# Patient Record
Sex: Female | Born: 1980 | Race: Asian | Hispanic: No | Marital: Married | Smoking: Never smoker
Health system: Southern US, Community
[De-identification: ages and names within clinical notes are randomized; demographics above are authoritative.]

## PROBLEM LIST (undated history)

## (undated) DIAGNOSIS — Z789 Other specified health status: Secondary | ICD-10-CM

## (undated) DIAGNOSIS — O4440 Low lying placenta NOS or without hemorrhage, unspecified trimester: Secondary | ICD-10-CM

## (undated) HISTORY — PX: NO PAST SURGERIES: SHX2092

---

## 2009-02-15 ENCOUNTER — Inpatient Hospital Stay (HOSPITAL_COMMUNITY): Admission: AD | Admit: 2009-02-15 | Discharge: 2009-02-15 | Payer: Self-pay | Admitting: Family Medicine

## 2009-05-16 ENCOUNTER — Ambulatory Visit: Payer: Self-pay | Admitting: Obstetrics and Gynecology

## 2009-05-16 ENCOUNTER — Inpatient Hospital Stay (HOSPITAL_COMMUNITY): Admission: AD | Admit: 2009-05-16 | Discharge: 2009-05-16 | Payer: Self-pay | Admitting: Obstetrics and Gynecology

## 2009-05-22 ENCOUNTER — Ambulatory Visit: Payer: Self-pay | Admitting: Advanced Practice Midwife

## 2009-05-22 ENCOUNTER — Inpatient Hospital Stay (HOSPITAL_COMMUNITY): Admission: AD | Admit: 2009-05-22 | Discharge: 2009-05-23 | Payer: Self-pay | Admitting: Obstetrics & Gynecology

## 2009-05-27 ENCOUNTER — Ambulatory Visit (HOSPITAL_COMMUNITY): Admission: RE | Admit: 2009-05-27 | Discharge: 2009-05-27 | Payer: Self-pay | Admitting: Obstetrics

## 2009-08-04 ENCOUNTER — Ambulatory Visit (HOSPITAL_COMMUNITY): Admission: RE | Admit: 2009-08-04 | Discharge: 2009-08-04 | Payer: Self-pay | Admitting: Obstetrics

## 2009-09-23 ENCOUNTER — Inpatient Hospital Stay (HOSPITAL_COMMUNITY): Admission: AD | Admit: 2009-09-23 | Discharge: 2009-09-25 | Payer: Self-pay | Admitting: Obstetrics & Gynecology

## 2010-06-26 LAB — CBC
HCT: 40.1 % (ref 36.0–46.0)
Hemoglobin: 11.4 g/dL — ABNORMAL LOW (ref 12.0–15.0)
MCV: 88.3 fL (ref 78.0–100.0)
MCV: 88.8 fL (ref 78.0–100.0)
RBC: 3.72 MIL/uL — ABNORMAL LOW (ref 3.87–5.11)
RDW: 13.1 % (ref 11.5–15.5)
RDW: 13.2 % (ref 11.5–15.5)
WBC: 14.3 10*3/uL — ABNORMAL HIGH (ref 4.0–10.5)
WBC: 18.4 10*3/uL — ABNORMAL HIGH (ref 4.0–10.5)

## 2010-06-26 LAB — RPR: RPR Ser Ql: NONREACTIVE

## 2010-06-30 LAB — DIFFERENTIAL
Eosinophils Relative: 7 % — ABNORMAL HIGH (ref 0–5)
Lymphocytes Relative: 5 % — ABNORMAL LOW (ref 12–46)
Monocytes Absolute: 0.6 10*3/uL (ref 0.1–1.0)
Neutro Abs: 12.6 10*3/uL — ABNORMAL HIGH (ref 1.7–7.7)

## 2010-06-30 LAB — URINALYSIS, ROUTINE W REFLEX MICROSCOPIC
Bilirubin Urine: NEGATIVE
Glucose, UA: 100 mg/dL — AB
Ketones, ur: NEGATIVE mg/dL
Nitrite: NEGATIVE
Protein, ur: NEGATIVE mg/dL
pH: 5.5 (ref 5.0–8.0)

## 2010-06-30 LAB — URINE MICROSCOPIC-ADD ON

## 2010-06-30 LAB — CBC
HCT: 32.7 % — ABNORMAL LOW (ref 36.0–46.0)
Hemoglobin: 11.6 g/dL — ABNORMAL LOW (ref 12.0–15.0)
MCV: 92.3 fL (ref 78.0–100.0)
Platelets: 263 10*3/uL (ref 150–400)
RBC: 3.58 MIL/uL — ABNORMAL LOW (ref 3.87–5.11)
RDW: 14.1 % (ref 11.5–15.5)
RDW: 14.2 % (ref 11.5–15.5)
WBC: 14.9 10*3/uL — ABNORMAL HIGH (ref 4.0–10.5)

## 2010-06-30 LAB — GLUCOSE, CAPILLARY

## 2010-07-13 LAB — URINE CULTURE
Colony Count: NO GROWTH
Culture: NO GROWTH

## 2010-07-13 LAB — URINALYSIS, ROUTINE W REFLEX MICROSCOPIC
Glucose, UA: NEGATIVE mg/dL
Nitrite: NEGATIVE
pH: 6 (ref 5.0–8.0)

## 2010-07-13 LAB — URINE MICROSCOPIC-ADD ON

## 2015-02-12 ENCOUNTER — Ambulatory Visit: Payer: PRIVATE HEALTH INSURANCE

## 2019-04-11 NOTE — L&D Delivery Note (Addendum)
OB/GYN Faculty Practice Delivery Note  Jessica Ewing is a 39 y.o. G2P1001 s/p SVD at [redacted]w[redacted]d. She was admitted for SOL/SROM.   ROM: 9h 71m with clear fluid GBS Status: neg Maximum Maternal Temperature: 98.3  Labor Progress: Marland Kitchen [name] had SROM at home at midnight with arrival to L&D in the early morning in latent labor. She received and epidural and progressed spontaneously to vag del.  Delivery Date/Time: December 15, 2019 at 0930 Delivery: Called to room and patient was complete and just starting to push. Head delivered ROA. Nuchal cord present x 1, not tight, but delivered through using the 'somersault manuever'. Shoulder and body delivered in usual fashion. Infant with spontaneous cry, placed on mother's abdomen, dried and stimulated. Cord clamped x 2 after 1-minute delay, and cut by FOB. Cord blood drawn. Placenta delivered spontaneously with gentle cord traction. Fundus firm with massage and Pitocin. Labia, perineum, vagina, and cervix inspected with repair of a 1st deg perineal lac using 3-0  Monocryl in the usual fashion.   Placenta: spont, intact Complications: none Lacerations: 1st deg perineal EBL: 150cc Analgesia: epidural  Postpartum Planning [x]  message to sent to schedule follow-up   Infant: girl  APGARs 9/10  2906g (6lb 6.5oz)  06-21-1973, CNM  12/15/2019 10:07 AM

## 2019-05-12 ENCOUNTER — Other Ambulatory Visit: Payer: Self-pay

## 2019-05-12 ENCOUNTER — Ambulatory Visit (INDEPENDENT_AMBULATORY_CARE_PROVIDER_SITE_OTHER): Payer: Medicaid Other

## 2019-05-12 VITALS — BP 124/77 | HR 95 | Temp 98.2°F | Ht 63.0 in | Wt 128.7 lb

## 2019-05-12 DIAGNOSIS — Z3201 Encounter for pregnancy test, result positive: Secondary | ICD-10-CM | POA: Diagnosis not present

## 2019-05-12 DIAGNOSIS — Z3480 Encounter for supervision of other normal pregnancy, unspecified trimester: Secondary | ICD-10-CM

## 2019-05-12 LAB — POCT URINE PREGNANCY: Preg Test, Ur: POSITIVE — AB

## 2019-05-12 MED ORDER — BLOOD PRESSURE KIT DEVI: 1.0000 | 0 refills | Status: DC

## 2019-05-12 NOTE — Progress Notes (Addendum)
Ms. Stillson presents today for UPT. She has no unusual complaints.  LMP:03/20/2019  EDD 12/25/2019 [redacted]w[redacted]d    OBJECTIVE: Appears well, in no apparent distress.  OB History    Gravida  2   Para  1   Term  1   Preterm      AB      Living  1     SAB      TAB      Ectopic      Multiple      Live Births  1          Home UPT Result:POSITIVE X2 In-Office UPT result:POSITIVE  I have reviewed the patient's medical, obstetrical, social, and family histories, and medications.   ASSESSMENT: Positive pregnancy test  PLAN Prenatal care to be completed at: [facility] BP Cuff ordered.  Patient seen and assessed by nursing staff during this encounter. I have reviewed the chart and agree with the documentation and plan.  Coral Ceo, MD 05/30/2019 10:29 AM    -------------------------------------------------------

## 2019-05-30 ENCOUNTER — Other Ambulatory Visit: Payer: Self-pay | Admitting: Obstetrics

## 2019-07-03 ENCOUNTER — Encounter: Payer: Medicaid Other | Admitting: Family Medicine

## 2019-07-07 ENCOUNTER — Other Ambulatory Visit (HOSPITAL_COMMUNITY)
Admission: RE | Admit: 2019-07-07 | Discharge: 2019-07-07 | Disposition: A | Discharge status: Home / Self Care | Payer: Medicaid Other | Source: Ambulatory Visit | Attending: Obstetrics | Admitting: Obstetrics

## 2019-07-07 ENCOUNTER — Encounter: Payer: Self-pay | Admitting: Obstetrics

## 2019-07-07 ENCOUNTER — Other Ambulatory Visit: Payer: Self-pay

## 2019-07-07 ENCOUNTER — Ambulatory Visit (INDEPENDENT_AMBULATORY_CARE_PROVIDER_SITE_OTHER): Payer: Medicaid Other | Admitting: Obstetrics

## 2019-07-07 VITALS — BP 125/72 | HR 83 | Wt 131.0 lb

## 2019-07-07 DIAGNOSIS — O0990 Supervision of high risk pregnancy, unspecified, unspecified trimester: Secondary | ICD-10-CM | POA: Insufficient documentation

## 2019-07-07 DIAGNOSIS — O09529 Supervision of elderly multigravida, unspecified trimester: Secondary | ICD-10-CM

## 2019-07-07 DIAGNOSIS — Z3A15 15 weeks gestation of pregnancy: Secondary | ICD-10-CM

## 2019-07-07 MED ORDER — VITAFOL ULTRA 29-0.6-0.4-200 MG PO CAPS
1.0000 | ORAL_CAPSULE | Freq: Every day | ORAL | 4 refills | Status: DC
Start: 2019-07-07 — End: 2019-09-11

## 2019-07-07 NOTE — Progress Notes (Signed)
Subjective:    Jessica Ewing is being seen today for her first obstetrical visit.  This is a planned pregnancy. She is at [redacted]w[redacted]d gestation. Her obstetrical history is significant for advanced maternal age. Relationship with FOB: spouse, living together. Patient does intend to breast feed. Pregnancy history fully reviewed.  The information documented in the HPI was reviewed and verified.  Menstrual History: OB History    Gravida  2   Para  1   Term  1   Preterm      AB      Living  1     SAB      TAB      Ectopic      Multiple      Live Births  1            Patient's last menstrual period was 03/20/2019.    History reviewed. No pertinent past medical history.  History reviewed. No pertinent surgical history.  (Not in a hospital admission)  No Known Allergies  Social History   Tobacco Use  . Smoking status: Never Smoker  . Smokeless tobacco: Never Used  Substance Use Topics  . Alcohol use: Never    History reviewed. No pertinent family history.   Review of Systems Constitutional: negative for weight loss Gastrointestinal: negative for vomiting Genitourinary:negative for genital lesions and vaginal discharge and dysuria Musculoskeletal:negative for back pain Behavioral/Psych: negative for abusive relationship, depression, illegal drug usage and tobacco use    Objective:    BP 125/72   Pulse 83   Wt 131 lb (59.4 kg)   LMP 03/20/2019   BMI 23.21 kg/m  General Appearance:    Alert, cooperative, no distress, appears stated age  Head:    Normocephalic, without obvious abnormality, atraumatic  Eyes:    PERRL, conjunctiva/corneas clear, EOM's intact, fundi    benign, both eyes  Ears:    Normal TM's and external ear canals, both ears  Nose:   Nares normal, septum midline, mucosa normal, no drainage    or sinus tenderness  Throat:   Lips, mucosa, and tongue normal; teeth and gums normal  Neck:   Supple, symmetrical, trachea midline, no adenopathy;     thyroid:  no enlargement/tenderness/nodules; no carotid   bruit or JVD  Back:     Symmetric, no curvature, ROM normal, no CVA tenderness  Lungs:     Clear to auscultation bilaterally, respirations unlabored  Chest Wall:    No tenderness or deformity   Heart:    Regular rate and rhythm, S1 and S2 normal, no murmur, rub   or gallop  Breast Exam:    No tenderness, masses, or nipple abnormality  Abdomen:     Soft, non-tender, bowel sounds active all four quadrants,    no masses, no organomegaly  Genitalia:    Normal female without lesion, discharge or tenderness  Extremities:   Extremities normal, atraumatic, no cyanosis or edema  Pulses:   2+ and symmetric all extremities  Skin:   Skin color, texture, turgor normal, no rashes or lesions  Lymph nodes:   Cervical, supraclavicular, and axillary nodes normal  Neurologic:   CNII-XII intact, normal strength, sensation and reflexes    throughout      Lab Review Urine pregnancy test Labs reviewed yes Radiologic studies reviewed no Assessment:    Pregnancy at [redacted]w[redacted]d weeks    Plan:     1. Supervision of high risk pregnancy, antepartum Rx: - Cytology - PAP - Cervicovaginal ancillary only -  Urine Culture - Obstetric Panel, Including HIV - Babyscripts Schedule Optimization - Genetic Screening - US MFM OB DETAIL +14 WK; Future - Prenat-Fe Poly-Methfol-FA-DHA (VITAFOL ULTRA) 29-0.6-0.4-200 MG CAPS; Take 1 capsule by mouth daily before breakfast.  Dispense: 90 capsule; Refill: 4  2. Antepartum multigravida of advanced maternal age   Prenatal vitamins.  Counseling provided regarding continued use of seat belts, cessation of alcohol consumption, smoking or use of illicit drugs; infection precautions i.e., influenza/TDAP immunizations, toxoplasmosis,CMV, parvovirus, listeria and varicella; workplace safety, exercise during pregnancy; routine dental care, safe medications, sexual activity, hot tubs, saunas, pools, travel, caffeine use, fish and  methlymercury, potential toxins, hair treatments, varicose veins Weight gain recommendations per IOM guidelines reviewed: underweight/BMI< 18.5--> gain 28 - 40 lbs; normal weight/BMI 18.5 - 24.9--> gain 25 - 35 lbs; overweight/BMI 25 - 29.9--> gain 15 - 25 lbs; obese/BMI >30->gain  11 - 20 lbs Problem list reviewed and updated. FIRST/CF mutation testing/NIPT/QUAD SCREEN/fragile X/Ashkenazi Jewish population testing/Spinal muscular atrophy discussed: requested. Role of ultrasound in pregnancy discussed; fetal survey: requested. Amniocentesis discussed: not indicated.  Meds ordered this encounter  Medications  . Prenat-Fe Poly-Methfol-FA-DHA (VITAFOL ULTRA) 29-0.6-0.4-200 MG CAPS    Sig: Take 1 capsule by mouth daily before breakfast.    Dispense:  90 capsule    Refill:  4   Orders Placed This Encounter  Procedures  . Urine Culture  . [place] MFM OB DETAIL +14 WK    Standing Status:   Future    Standing Expiration Date:   09/05/2020    Order Specific Question:   Reason for Exam (SYMPTOM  OR DIAGNOSIS REQUIRED)    Answer:   Anatomy    Order Specific Question:   Preferred Location    Answer:   [facility]  . Obstetric Panel, Including HIV  . Genetic Screening    Follow up in 4 weeks. 50% of 25 min visit spent on counseling and coordination of care.     Brock Bad, MD 07/07/2019 1:50 PM

## 2019-07-08 ENCOUNTER — Other Ambulatory Visit: Payer: Self-pay | Admitting: Obstetrics

## 2019-07-08 DIAGNOSIS — N760 Acute vaginitis: Secondary | ICD-10-CM

## 2019-07-08 DIAGNOSIS — B9689 Other specified bacterial agents as the cause of diseases classified elsewhere: Secondary | ICD-10-CM

## 2019-07-08 LAB — CERVICOVAGINAL ANCILLARY ONLY
Bacterial Vaginitis (gardnerella): POSITIVE — AB
Candida Glabrata: NEGATIVE
Candida Vaginitis: NEGATIVE
Chlamydia: NEGATIVE
Comment: NEGATIVE
Comment: NEGATIVE
Comment: NEGATIVE
Comment: NEGATIVE
Comment: NEGATIVE
Comment: NORMAL
Neisseria Gonorrhea: NEGATIVE
Trichomonas: NEGATIVE

## 2019-07-08 LAB — CYTOLOGY - PAP
Comment: NEGATIVE
Diagnosis: NEGATIVE
High risk HPV: NEGATIVE

## 2019-07-08 MED ORDER — TINIDAZOLE 500 MG PO TABS: 1000.0000 mg | ORAL_TABLET | Freq: Every day | ORAL | 2 refills | Status: DC

## 2019-07-09 ENCOUNTER — Other Ambulatory Visit: Payer: Self-pay

## 2019-07-09 MED ORDER — VITAFOL GUMMIES 3.33-0.333-34.8 MG PO CHEW: 3.0000 | CHEWABLE_TABLET | Freq: Every day | ORAL | 6 refills | Status: DC

## 2019-07-31 ENCOUNTER — Other Ambulatory Visit: Payer: Self-pay

## 2019-07-31 ENCOUNTER — Ambulatory Visit (HOSPITAL_COMMUNITY): Payer: Medicaid Other | Admitting: *Deleted

## 2019-07-31 ENCOUNTER — Ambulatory Visit (HOSPITAL_COMMUNITY)
Admission: RE | Admit: 2019-07-31 | Discharge: 2019-07-31 | Disposition: A | Discharge status: Home / Self Care | Payer: Medicaid Other | Source: Ambulatory Visit | Attending: Obstetrics and Gynecology | Admitting: Obstetrics and Gynecology

## 2019-07-31 ENCOUNTER — Encounter (HOSPITAL_COMMUNITY): Payer: Self-pay

## 2019-07-31 ENCOUNTER — Ambulatory Visit (HOSPITAL_COMMUNITY): Payer: Medicaid Other

## 2019-07-31 VITALS — BP 124/74 | HR 78 | Temp 98.1°F

## 2019-07-31 DIAGNOSIS — Z3A19 19 weeks gestation of pregnancy: Secondary | ICD-10-CM

## 2019-07-31 DIAGNOSIS — O09529 Supervision of elderly multigravida, unspecified trimester: Secondary | ICD-10-CM | POA: Insufficient documentation

## 2019-07-31 DIAGNOSIS — O0990 Supervision of high risk pregnancy, unspecified, unspecified trimester: Secondary | ICD-10-CM | POA: Insufficient documentation

## 2019-07-31 DIAGNOSIS — Z363 Encounter for antenatal screening for malformations: Secondary | ICD-10-CM

## 2019-07-31 DIAGNOSIS — O09522 Supervision of elderly multigravida, second trimester: Secondary | ICD-10-CM | POA: Insufficient documentation

## 2019-07-31 DIAGNOSIS — Z3480 Encounter for supervision of other normal pregnancy, unspecified trimester: Secondary | ICD-10-CM

## 2019-08-04 ENCOUNTER — Telehealth (INDEPENDENT_AMBULATORY_CARE_PROVIDER_SITE_OTHER): Payer: Medicaid Other | Admitting: Obstetrics and Gynecology

## 2019-08-04 ENCOUNTER — Encounter: Payer: Self-pay | Admitting: Obstetrics and Gynecology

## 2019-08-04 DIAGNOSIS — Z3A19 19 weeks gestation of pregnancy: Secondary | ICD-10-CM

## 2019-08-04 DIAGNOSIS — Z3482 Encounter for supervision of other normal pregnancy, second trimester: Secondary | ICD-10-CM

## 2019-08-04 DIAGNOSIS — O900 Disruption of cesarean delivery wound: Secondary | ICD-10-CM | POA: Diagnosis not present

## 2019-08-04 DIAGNOSIS — Z3480 Encounter for supervision of other normal pregnancy, unspecified trimester: Secondary | ICD-10-CM

## 2019-08-04 NOTE — Progress Notes (Signed)
OBSTETRICS PRENATAL VIRTUAL VISIT ENCOUNTER NOTE  Provider location: [facility]   I connected with Jessica Ewing on 08/04/19 at  1:30 PM EDT by MyChart Video Encounter at home and verified that I am speaking with the correct person using two identifiers.   I discussed the limitations, risks, security and privacy concerns of performing an evaluation and management service virtually and the availability of in person appointments. I also discussed with the patient that there may be a patient responsible charge related to this service. The patient expressed understanding and agreed to proceed. Subjective:  Jessica Ewing is a 39 y.o. G2P1001 at [redacted]w[redacted]d being seen today for ongoing prenatal care.  She is currently monitored for the following issues for this low-risk pregnancy and has Supervision of other normal pregnancy, antepartum on their problem list.  Patient reports no complaints.  Contractions: Not present. Vag. Bleeding: None.  Movement: Present. Denies any leaking of fluid.   The following portions of the patient's history were reviewed and updated as appropriate: allergies, current medications, past family history, past medical history, past social history, past surgical history and problem list.   Objective:  There were no vitals filed for this visit.  Fetal Status:     Movement: Present     General:  Alert, oriented and cooperative. Patient is in no acute distress.  Respiratory: Normal respiratory effort, no problems with respiration noted  Mental Status: Normal mood and affect. Normal behavior. Normal judgment and thought content.  Rest of physical exam deferred due to type of encounter  Imaging: [place] MFM OB DETAIL +14 WK  Result Date: 07/31/2019 ----------------------------------------------------------------------  OBSTETRICS REPORT                       (Signed Final 07/31/2019 11:43 am)  ---------------------------------------------------------------------- Patient Info  ID #:       [id number]                          D.O.B.:  02-03-1981 (38 yrs)  Name:       Jessica Ewing                    Visit Date: 07/31/2019 09:05 am ---------------------------------------------------------------------- Performed By  Performed By:     Tommie Raymond BS,       Ref. Address:     [address], [address]                                                             [address]                                                             [phone]  [place]                                                             [id number]  Attending:        Ma Rings MD         [facility]  Referred By:      [facility] ---------------------------------------------------------------------- Orders   #  Description                          Code         Ordered By   1  [place] MFM OB DETAIL +14 WK              76811.01     Coral Ceo  ----------------------------------------------------------------------   #  Order #                    Accession #                 Episode #   1  [id number]                   [id number]                  [id number]  ---------------------------------------------------------------------- Indications   Advanced maternal age multigravida 16+,        O61.522   second trimester   [redacted] weeks gestation of pregnancy                Z3A.19   Encounter for antenatal screening for          Z36.3   malformations  ---------------------------------------------------------------------- Fetal Evaluation  Num Of Fetuses:         1  Fetal Heart Rate(bpm):  143  Cardiac Activity:       Observed  Presentation:           Cephalic  Placenta:               Anterior  P. Cord Insertion:      Not well visualized  Amniotic Fluid  AFI FV:      Within normal limits                               Largest Pocket(cm)                              5.5 ---------------------------------------------------------------------- Biometry  BPD:      46.2  mm     G. Age:  20w 0d         86  %    CI:        79.21   %    70 - 86  FL/HC:      17.6   %    16.1 - 18.3  HC:      164.1  mm     G. Age:  19w 1d         50  %    HC/AC:      1.15        1.09 - 1.39  AC:       143   mm     G. Age:  19w 5d         68  %    FL/BPD:     62.3   %  FL:       28.8  mm     G. Age:  18w 6d         37  %    FL/AC:      20.1   %    20 - 24  HUM:      27.4  mm     G. Age:  18w 5d         43  %  CER:        19  mm     G. Age:  18w 4d         35  %  NFT:       4.4  mm  LV:        7.1  mm  CM:        4.3  mm  Est. FW:     284  gm    0 lb 10 oz      63  % ---------------------------------------------------------------------- OB History  Gravidity:    2         Term:   1        Prem:   0        SAB:   0  TOP:          0       Ectopic:  0        Living: 0 ---------------------------------------------------------------------- Gestational Age  LMP:           19w 0d        Date:  03/20/19                 EDD:   12/25/19  U/S Today:     19w 3d                                        EDD:   12/22/19  Best:          19w 0d     Det. By:  LMP  (03/20/19)          EDD:   12/25/19 ---------------------------------------------------------------------- Anatomy  Cranium:               Appears normal         LVOT:                   Appears normal  Cavum:                 Appears normal         Aortic Arch:            Appears normal  Ventricles:            Appears normal  Ductal Arch:            Appears normal  Choroid Plexus:        Appears normal         Diaphragm:              Appears normal  Cerebellum:            Appears normal         Stomach:                Appears normal, left                                                                        sided  Posterior Fossa:        Appears normal         Abdomen:                Appears normal  Nuchal Fold:           Appears normal         Abdominal Wall:         Appears nml (cord                                                                        insert, abd wall)  Face:                  Appears normal         Cord Vessels:           Appears normal (3                         (orbits and profile)                           vessel cord)  Lips:                  Appears normal         Kidneys:                Appear normal  Palate:                Appears normal         Bladder:                Appears normal  Thoracic:              Appears normal         Spine:                  Appears normal  Heart:                 Echogenic focus        Upper Extremities:      Appears normal  in LV  RVOT:                  Appears normal         Lower Extremities:      Appears normal  Other:  Female gender Open hands visualized. Heels and 5th digit previously          seen. Nasal bone visualized. ---------------------------------------------------------------------- Cervix Uterus Adnexa  Cervix  Length:            4.4  cm.  Normal appearance by transabdominal scan.  Uterus  No abnormality visualized.  Left Ovary  Within normal limits. No adnexal mass visualized.  Right Ovary  Not visualized. No adnexal mass visualized.  Cul De Sac  No free fluid seen.  Adnexa  No abnormality visualized. ---------------------------------------------------------------------- Comments  This patient was seen for a detailed fetal anatomy scan due  to advanced maternal age.  She denies any significant past medical history and denies  any problems in her current pregnancy.  The patient has not had any screening tests for fetal  aneuploidy drawn in her current pregnancy.  She was informed that the fetal growth and amniotic fluid  level were appropriate for her gestational age.  On today's exam, an intracardiac echogenic focus was noted  in the left ventricle of the  fetal heart.  The small association  between an echogenic focus and Down syndrome was  discussed. Due to the echogenic focus noted today, the  patient was offered and declined an amniocentesis today for  definitive diagnosis of fetal aneuploidy.  She will have a cell  free DNA test to screen for fetal aneuploidy instead.  The patient was informed that anomalies may be missed due  to technical limitations. If the fetus is in a suboptimal position  or maternal habitus is increased, visualization of the fetus in  the maternal uterus may be impaired.  The patient had a Sequenome cell free DNA test drawn  following today's ultrasound exam.  Follow up as indicated.  All conversations were held with the patient today with the  help of a [place] interpreter. ----------------------------------------------------------------------                   Johnell Comings, MD Electronically Signed Final Report   07/31/2019 11:43 am ----------------------------------------------------------------------   Assessment and Plan:  Pregnancy: G2P1001 at [redacted]w[redacted]d 1. Supervision of other normal pregnancy, antepartum Patient is doing well without complaints Reviewed anatomy ultrasound report [id number] collected today Patient to come in person for next ob visit as new ob labs were not collected Patient plans to pick up BP cuff today  Preterm labor symptoms and general obstetric precautions including but not limited to vaginal bleeding, contractions, leaking of fluid and fetal movement were reviewed in detail with the patient. I discussed the assessment and treatment plan with the patient. The patient was provided an opportunity to ask questions and all were answered. The patient agreed with the plan and demonstrated an understanding of the instructions. The patient was advised to call back or seek an in-person office evaluation/go to MAU at [facility] for any urgent or concerning symptoms. Please refer to After Visit  Summary for other counseling recommendations.   I provided 11 minutes of face-to-face time during this encounter.  Return in about 4 weeks (around 09/01/2019) for ROB, Low risk, in person; patient needs new ob labs.  No future appointments.  Mora Bellman, MD [facility] [place]

## 2019-08-04 NOTE — Progress Notes (Signed)
I connected with  Jessica Ewing on 08/04/19 by a video enabled telemedicine application and verified that I am speaking with the correct person using two identifiers.   I discussed the limitations of evaluation and management by telemedicine. The patient expressed understanding and agreed to proceed.  Mychart OB, reports no problems today.  She wants to discuss her Ultrasound results 07/31/19.  She will go and pick up her BP cuff later today.

## 2019-08-05 LAB — MATERNIT21 PLUS CORE+SCA
Fetal Fraction: 9
Monosomy X (Turner Syndrome): NOT DETECTED
Result (T21): NEGATIVE
Trisomy 13 (Patau syndrome): NEGATIVE
Trisomy 18 (Edwards syndrome): NEGATIVE
Trisomy 21 (Down syndrome): NEGATIVE
XXX (Triple X Syndrome): NOT DETECTED
XXY (Klinefelter Syndrome): NOT DETECTED
XYY (Jacobs Syndrome): NOT DETECTED

## 2019-08-06 ENCOUNTER — Telehealth (HOSPITAL_COMMUNITY): Payer: Self-pay | Admitting: Genetic Counselor

## 2019-08-06 NOTE — Telephone Encounter (Signed)
Attempted to call Ms. Imbert with the help of [employer], [place] [phone]; however, call went to voicemail. LVM for Ms. Gillispie re: good news about screening results. Requested a call back to my direct line to discuss these in more detail, as no identifiers were provided in voicemail message.   Gershon Crane, MS, [facility] Genetic Counselor

## 2019-08-06 NOTE — Telephone Encounter (Signed)
I called Jessica Ewing with the help of [place] Jessica Ewing, Jessica Ewing to discuss her negative noninvasive prenatal screening (NIPS)/cell free DNA (cfDNA) testing result. Specifically, Jessica Ewing had MaterniT21 testing through [employer]. Testing was offered because of an echogenic intracardiac focus (EIF) that was identified on ultrasound. These negative results demonstrated an expected representation of chromosome 21, 18, 13, and sex chromosome material, greatly reducing the likelihood of trisomies 44, 1, or 65 and sex chromosome aneuploidies for the pregnancy. We discussed that the EIF identified in the baby's heart is likely a normal variant in the context of this low-risk NIPS result.  NIPS analyzes placental (fetal) DNA in maternal circulation. NIPS is considered to be highly specific and sensitive, but is not considered to be a diagnostic test. We reviewed that this testing identifies 91-99% of pregnancies with trisomies 8, 69, and 69, as well as sex chromosome abnormalities, but does not test for all genetic conditions. Diagnostic testing via amniocentesis is available should she be interested in confirming this result. Jessica Ewing was relieved to hear these results and confirmed that she had no questions about these results at this time.  Jessica Crane, MS, [facility] Genetic Counselor

## 2019-08-18 ENCOUNTER — Telehealth: Payer: Self-pay

## 2019-08-18 NOTE — Telephone Encounter (Signed)
Pt called reporting that she has a runny nose, itching, and cough. I advised pt that this may be allergies and to try benadryl as directed on the box. Pt denies any cramping or bleeding and reports good fetal movement. I advised pt that if it does not work or if she is having any trouble breathing she should go to the hospital for evaluation. Pt voices understanding.

## 2019-08-29 ENCOUNTER — Emergency Department (HOSPITAL_COMMUNITY)
Admission: EM | Admit: 2019-08-29 | Discharge: 2019-08-29 | Disposition: A | Discharge status: Home / Self Care | Payer: Medicaid Other | Attending: Emergency Medicine | Admitting: Emergency Medicine

## 2019-08-29 ENCOUNTER — Encounter (HOSPITAL_COMMUNITY): Payer: Self-pay | Admitting: Emergency Medicine

## 2019-08-29 DIAGNOSIS — U071 COVID-19: Secondary | ICD-10-CM | POA: Insufficient documentation

## 2019-08-29 DIAGNOSIS — Z3A00 Weeks of gestation of pregnancy not specified: Secondary | ICD-10-CM | POA: Insufficient documentation

## 2019-08-29 DIAGNOSIS — E876 Hypokalemia: Secondary | ICD-10-CM | POA: Diagnosis not present

## 2019-08-29 DIAGNOSIS — O98512 Other viral diseases complicating pregnancy, second trimester: Secondary | ICD-10-CM | POA: Diagnosis not present

## 2019-08-29 DIAGNOSIS — O98519 Other viral diseases complicating pregnancy, unspecified trimester: Secondary | ICD-10-CM | POA: Insufficient documentation

## 2019-08-29 DIAGNOSIS — O99282 Endocrine, nutritional and metabolic diseases complicating pregnancy, second trimester: Secondary | ICD-10-CM | POA: Diagnosis not present

## 2019-08-29 DIAGNOSIS — Z79899 Other long term (current) drug therapy: Secondary | ICD-10-CM | POA: Insufficient documentation

## 2019-08-29 LAB — CBC WITH DIFFERENTIAL/PLATELET
Abs Immature Granulocytes: 0.03 10*3/uL (ref 0.00–0.07)
Basophils Absolute: 0 10*3/uL (ref 0.0–0.1)
Basophils Relative: 0 %
Eosinophils Absolute: 0.1 10*3/uL (ref 0.0–0.5)
Eosinophils Relative: 1 %
HCT: 37.9 % (ref 36.0–46.0)
Hemoglobin: 12.6 g/dL (ref 12.0–15.0)
Immature Granulocytes: 0 %
Lymphocytes Relative: 12 %
Lymphs Abs: 1 10*3/uL (ref 0.7–4.0)
MCH: 28.7 pg (ref 26.0–34.0)
MCHC: 33.2 g/dL (ref 30.0–36.0)
MCV: 86.3 fL (ref 80.0–100.0)
Monocytes Absolute: 0.5 10*3/uL (ref 0.1–1.0)
Monocytes Relative: 7 %
Neutro Abs: 6.5 10*3/uL (ref 1.7–7.7)
Neutrophils Relative %: 80 %
Platelets: 260 10*3/uL (ref 150–400)
RBC: 4.39 MIL/uL (ref 3.87–5.11)
RDW: 15 % (ref 11.5–15.5)
WBC: 8.1 10*3/uL (ref 4.0–10.5)
nRBC: 0 % (ref 0.0–0.2)

## 2019-08-29 LAB — COMPREHENSIVE METABOLIC PANEL
ALT: 24 U/L (ref 0–44)
AST: 23 U/L (ref 15–41)
Albumin: 2.9 g/dL — ABNORMAL LOW (ref 3.5–5.0)
Alkaline Phosphatase: 59 U/L (ref 38–126)
Anion gap: 12 (ref 5–15)
BUN: <5 mg/dL — ABNORMAL LOW (ref 6–20)
CO2: 22 mmol/L (ref 22–32)
Calcium: 8.1 mg/dL — ABNORMAL LOW (ref 8.9–10.3)
Chloride: 98 mmol/L (ref 98–111)
Creatinine, Ser: 0.63 mg/dL (ref 0.44–1.00)
GFR calc Af Amer: >60 mL/min (ref >60)
GFR calc non Af Amer: >60 mL/min (ref >60)
Glucose, Bld: 87 mg/dL (ref 70–99)
Potassium: 3.3 mmol/L — ABNORMAL LOW (ref 3.5–5.1)
Sodium: 132 mmol/L — ABNORMAL LOW (ref 135–145)
Total Bilirubin: 0.9 mg/dL (ref 0.3–1.2)
Total Protein: 6.6 g/dL (ref 6.5–8.1)

## 2019-08-29 LAB — LACTIC ACID, PLASMA: Lactic Acid, Venous: 0.9 mmol/L (ref 0.5–1.9)

## 2019-08-29 LAB — URINALYSIS, ROUTINE W REFLEX MICROSCOPIC
Bilirubin Urine: NEGATIVE
Glucose, UA: NEGATIVE mg/dL
Hgb urine dipstick: NEGATIVE
Ketones, ur: 80 mg/dL — AB
Leukocytes,Ua: NEGATIVE
Nitrite: NEGATIVE
Protein, ur: NEGATIVE mg/dL
Specific Gravity, Urine: 1.012 (ref 1.005–1.030)
pH: 6 (ref 5.0–8.0)

## 2019-08-29 LAB — POC SARS CORONAVIRUS 2 AG -  ED: SARS Coronavirus 2 Ag: POSITIVE — AB

## 2019-08-29 MED ORDER — SODIUM CHLORIDE 0.9 % IV BOLUS
1000.0000 mL | Freq: Once | INTRAVENOUS | Status: AC
  Administered 2019-08-29: 1000 mL via INTRAVENOUS

## 2019-08-29 MED ORDER — POTASSIUM CHLORIDE CRYS ER 20 MEQ PO TBCR
20.0000 meq | EXTENDED_RELEASE_TABLET | Freq: Once | ORAL | Status: AC
  Administered 2019-08-29: 20 meq via ORAL
  Filled 2019-08-29: qty 1

## 2019-08-29 MED ORDER — ACETAMINOPHEN 325 MG PO TABS
650.0000 mg | ORAL_TABLET | Freq: Once | ORAL | Status: AC
  Administered 2019-08-29: 650 mg via ORAL
  Filled 2019-08-29: qty 2

## 2019-08-29 NOTE — ED Provider Notes (Addendum)
[facility] EMERGENCY DEPARTMENT Provider Note   CSN: [id number] Arrival date & time: 08/29/19  0349     History Chief Complaint  Patient presents with  . URI    Jessica Ewing is a 39 y.o. female.  HPI      Jessica Ewing is a 39 y.o. female, presenting to the ED with fever for the last 3 days.  T-max 102 F.  She has been intermittently taking Tylenol for her fever with last dose last night. Occasional nonproductive cough and nasal congestion.  During my initial MSE assessment in triage, patient mentions some right-sided abdominal discomfort, however, she denies any such discomfort during my subsequent assessment. She is followed by Dr. Jodi Mourning, OB/GYN.  EDD December 25, 2019.  She denies shortness of breath, chest pain, vaginal bleeding, vaginal discharge, urinary symptoms, dizziness, syncope, or any other complaints.   History reviewed. No pertinent past medical history.  Patient Active Problem List   Diagnosis Date Noted  . Supervision of other normal pregnancy, antepartum 05/12/2019    Past Surgical History:  Procedure Laterality Date  . NO PAST SURGERIES       OB History    Gravida  2   Para  1   Term  1   Preterm      AB      Living  1     SAB      TAB      Ectopic      Multiple      Live Births  1           No family history on file.  Social History   Tobacco Use  . Smoking status: Never Smoker  . Smokeless tobacco: Never Used  Substance Use Topics  . Alcohol use: Never  . Drug use: Never    Home Medications Prior to Admission medications   Medication Sig Start Date End Date Taking? Authorizing Provider  Blood Pressure Monitoring (BLOOD PRESSURE KIT) DEVI 1 kit by Does not apply route once a week. Check Blood Pressure regularly and record readings into the [employer] App.  Large Cuff.  DX O90.0 05/12/19   Shelly Bombard, MD  Prenat-Fe Poly-Methfol-FA-DHA (VITAFOL ULTRA) 29-0.6-0.4-200 MG CAPS Take 1 capsule  by mouth daily before breakfast. Patient not taking: Reported on 07/31/2019 07/07/19   Shelly Bombard, MD  Prenatal Vit-Fe Phos-FA-Omega (VITAFOL GUMMIES) 3.33-0.333-34.8 MG CHEW Chew 3 Doses by mouth daily. 07/09/19   Shelly Bombard, MD  tinidazole (TINDAMAX) 500 MG tablet Take 2 tablets (1,000 mg total) by mouth daily with breakfast. 07/08/19   Shelly Bombard, MD    Allergies    Patient has no known allergies.  Review of Systems   Review of Systems  Constitutional: Positive for fever.  HENT: Positive for congestion and rhinorrhea. Negative for sore throat, trouble swallowing and voice change.   Respiratory: Negative for shortness of breath.   Cardiovascular: Negative for chest pain.  Gastrointestinal: Negative for abdominal pain, diarrhea, nausea and vomiting.  Genitourinary: Negative for dysuria, flank pain, vaginal bleeding and vaginal discharge.  Musculoskeletal: Negative for back pain and neck pain.  Neurological: Negative for dizziness, syncope, weakness and headaches.  All other systems reviewed and are negative.   Physical Exam Updated Vital Signs BP 129/69 (BP Location: Left Arm)   Pulse (!) 101   Temp (!) 100.9 F (38.3 C) (Oral)   Resp 16   Ht _0  (1.6 m)   Wt 59 kg  LMP 03/20/2019   SpO2 100%   BMI 23.03 kg/m   Physical Exam Vitals and nursing note reviewed.  Constitutional:      General: She is not in acute distress.    Appearance: She is well-developed. She is not diaphoretic.  HENT:     Head: Normocephalic and atraumatic.     Mouth/Throat:     Mouth: Mucous membranes are moist.     Pharynx: Oropharynx is clear.  Eyes:     Conjunctiva/sclera: Conjunctivae normal.  Cardiovascular:     Rate and Rhythm: Normal rate and regular rhythm.     Pulses: Normal pulses.          Radial pulses are 2+ on the right side and 2+ on the left side.       Posterior tibial pulses are 2+ on the right side and 2+ on the left side.     Heart sounds: Normal heart  sounds.     Comments: Tactile temperature in the extremities appropriate and equal bilaterally. Pulmonary:     Effort: Pulmonary effort is normal. No respiratory distress.     Breath sounds: Normal breath sounds.     Comments: No increased work of breathing.  Speaks in full sentences without difficulty. Abdominal:     Palpations: Abdomen is soft.     Tenderness: There is no abdominal tenderness. There is no guarding.     Comments: Gravid abdomen without any tenderness.  Musculoskeletal:     Cervical back: Neck supple.     Right lower leg: No edema.     Left lower leg: No edema.  Lymphadenopathy:     Cervical: No cervical adenopathy.  Skin:    General: Skin is warm and dry.  Neurological:     Mental Status: She is alert.  Psychiatric:        Mood and Affect: Mood and affect normal.        Speech: Speech normal.        Behavior: Behavior normal.     ED Results / Procedures / Treatments   Labs (all labs ordered are listed, but only abnormal results are displayed) Labs Reviewed  URINALYSIS, ROUTINE W REFLEX MICROSCOPIC - Abnormal; Notable for the following components:      Result Value   APPearance HAZY (*)    Ketones, ur 80 (*)    All other components within normal limits  COMPREHENSIVE METABOLIC PANEL - Abnormal; Notable for the following components:   Sodium 132 (*)    Potassium 3.3 (*)    BUN <5 (*)    Calcium 8.1 (*)    Albumin 2.9 (*)    All other components within normal limits  POC SARS CORONAVIRUS 2 AG -  ED - Abnormal; Notable for the following components:   SARS Coronavirus 2 Ag POSITIVE (*)    All other components within normal limits  URINE CULTURE  CULTURE, BLOOD (ROUTINE X 2)  CULTURE, BLOOD (ROUTINE X 2)  CBC WITH DIFFERENTIAL/PLATELET  LACTIC ACID, PLASMA  LACTIC ACID, PLASMA    EKG None  Radiology No results found.  Procedures Procedures (including critical care time)  Medications Ordered in ED Medications  sodium chloride 0.9 % bolus  1,000 mL (0 mLs Intravenous Stopped 08/29/19 1527)  potassium chloride SA (KLOR-CON) CR tablet 20 mEq (20 mEq Oral Given 08/29/19 1349)  acetaminophen (TYLENOL) tablet 650 mg (650 mg Oral Given 08/29/19 1349)    ED Course  I have reviewed the triage vital signs and the nursing  notes.  Pertinent labs & imaging results that were available during my care of the patient were reviewed by me and considered in my medical decision making (see chart for details).  Clinical Course as of Aug 29 1847  Fri Aug 29, 2019  0949 Spoke with Anderson Malta, CNM at [facility].  We discussed the patient's symptoms.  She advises to start the patient's work-up here in our ED.   [SJ]  [id number] Atempted to call number listed in Amion for OB rapid response ([phone]), but reached a busy signal.   [SJ]  1241 Rapid OB RN assessed the patient.  The patient told her she has not been having any abdominal discomfort.  She has been cleared from an OB standpoint.   [SJ]  2458 Patient reassessed.  She denies any current complaints.  Continues to deny any abdominal pain.   [SJ]  0998 Corrects to about 9.0 mg/dL when hypoalbuminemia is taken into account.  Calcium(!): 8.1 [SJ]  1541 Patient's manual pulse rate at 96.  Pulse Rate(!): 102 [SJ]    Clinical Course User Index [SJ] Joy, Helane Gunther, PA-C   MDM Rules/Calculators/A&P                       Patient presents ultimately complaining of fever and nasal congestion. Initial work-up, including labs, ordered based on the fact the patient was complaining of fever and abdominal pain while in triage. Patient is nontoxic appearing, not tachypneic, not hypotensive, maintains excellent SPO2 on room air, and is in no apparent distress.  Febrile and mildly tachycardic. Fetal heart rate around 160.  Patient has felt fetal movement as she normally would. I have reviewed the patient's chart to obtain more information.   I reviewed and interpreted the patient's labs. Mild hyponatremia addressed  with IV fluids.  Mild hypokalemia also noted and addressed. Ketonuria without presence of glucose may be indication of some dehydration.  Patient is positive for COVID-19.  She does not meet criteria for inpatient management at this time.  The patient was given instructions for home care as well as return precautions. Patient voices understanding of these instructions, accepts the plan, and is comfortable with discharge.  Findings and plan of care discussed with Gerlene Fee, MD.   Vitals:   08/29/19 0931 08/29/19 1137 08/29/19 1528  BP: 129/69 116/71 101/65  Pulse: (!) 101 (!) 102 99  Resp: _0 Temp: (!) 100.9 F (38.3 C) 98.9 F (37.2 C) 99.2 F (37.3 C)  TempSrc: Oral Oral Oral  SpO2: 100% 99% 97%  Weight: 59 kg    Height: _1  (1.6 m)       Jessica Ewing was evaluated in Emergency Department on 08/29/2019 for the symptoms described in the history of present illness. She was evaluated in the context of the global COVID-19 pandemic, which necessitated consideration that the patient might be at risk for infection with the SARS-CoV-2 virus that causes COVID-19. Institutional protocols and algorithms that pertain to the evaluation of patients at risk for COVID-19 are in a state of rapid change based on information released by regulatory bodies including the CDC and federal and state organizations. These policies and algorithms were followed during the patient's care in the ED.  Final Clinical Impression(s) / ED Diagnoses Final diagnoses:  COVID-19  Hypokalemia    Rx / DC Orders ED Discharge Orders    None       Layla Maw 08/29/19 1541    Joy,  Maceo Pro 08/29/19 1849    Maudie Flakes, MD 08/30/19 1245

## 2019-08-29 NOTE — ED Triage Notes (Addendum)
Pt here today with c/o of allergy like symptoms with eye burning and nose burning- pt states she was afraid to take any allergy medications while pregnant. Pt si [redacted] weeks pregnant. No vaginal bleeding or leaking of fluid. PA in room to assess pt and will call [facility] to advise if pt should stay here or come there,

## 2019-08-29 NOTE — Discharge Instructions (Signed)
COVID-19 and General Viral Syndrome Care Instructions:  Your symptoms are likely consistent with COVID-19 infection, which is a viral illness. Viruses do not require or respond to antibiotics. Treatment is symptomatic care and it is important to note that these symptoms may last for 7-14 days.   Hand washing: Wash your hands throughout the day, but especially before and after touching the face, using the restroom, sneezing, coughing, or touching surfaces that have been coughed or sneezed upon. Hydration: Symptoms of most illnesses will be intensified and complicated by dehydration. Dehydration can also extend the duration of symptoms. Drink plenty of fluids and get plenty of rest. You should be drinking at least half a liter of water an hour to stay hydrated. Electrolyte drinks (ex. Gatorade, Powerade, Pedialyte) are also encouraged. You should be drinking enough fluids to make your urine light yellow, almost clear. If this is not the case, you are not drinking enough water. Please note that some of the treatments indicated below will not be effective if you are not adequately hydrated. Diet: Please concentrate on hydration, however, you may introduce food slowly.  Start with a clear liquid diet, progressed to a full liquid diet, and then bland solids as you are able. Pain or fever: Acetaminophen (generic for Tylenol) for pain or fever.  Acetaminophen: May take acetaminophen (generic for Tylenol), as needed, for pain. Your daily total maximum amount of acetaminophen from all sources should be limited to 4000mg /day for persons without liver problems, or 2000mg /day for those with liver problems. Cough: Teas, warm liquids, broths, and honey can help with cough. Fluticasone: Use fluticasone (generic for Flonase), as directed, for nasal and sinus congestion.  This medication is available over-the-counter. Congestion:  Saline sinus rinses and saline nasal sprays may also help relieve congestion.  Sore throat:  Warm liquids or Chloraseptic spray may help soothe a sore throat. Gargle twice a day with a salt water solution made from a half teaspoon of salt in a cup of warm water.  Follow up: Follow up with a primary care provider within the next two weeks should symptoms fail to resolve. Return: Return to the ED for significantly worsening symptoms, shortness of breath, persistent vomiting, large amounts of blood in stool, or any other major concerns.  For prescription assistance, may try using prescription discount sites or apps, such as goodrx.com  COVID-19 isolation recommendations  Patients who have symptoms consistent with COVID-19 should self isolate until: At least 3 days (72 hours) have passed since recovery, defined as resolution of fever without the use of fever reducing medications and improvement in respiratory symptoms (e.g., cough, shortness of breath), and At least 7 days have passed since symptoms first appeared. Retesting is not required and not recommended as patients can continue to test positive for several weeks despite lack of symptoms.

## 2019-08-29 NOTE — Progress Notes (Signed)
Dr Jolayne Panther informed that pt is G2P1 at 23.1 wk here with sneezing, coughing and fever x1wk.  Pt has just tested positive for covid.  ED nurses dopplered fhr at 160bpm before rrob nurse arrived. Pt reports no abdominal pain or cramping, no LOF, no vaginal discharge or bleeding.  Pt also reports positive fetal movement.  Dr Jolayne Panther says pt may be cleared obstetrically but to be informed if pt needs to be admitted to a unit for Covid.

## 2019-08-29 NOTE — ED Notes (Signed)
TC to OB rapid response for eval of Pt .

## 2019-08-29 NOTE — ED Provider Notes (Addendum)
MSE was initiated and I personally evaluated the patient and placed orders (if any) at  9:40 AM on Aug 29, 2019.  Jessica Ewing is a 39 y.o. female, presenting to the ED with fever for the last 3 days.  T-max 102 F.  She has been intermittently taking Tylenol for her fever with last dose last night. She notes some right-sided abdominal pain that she states is hard to describe, but seems to be constant.  It is mild to moderate, nonradiating. Occasional nonproductive cough and nasal congestion. She is followed by Dr. Clearance Coots, OB/GYN.  EDD December 25, 2019.  Denies shortness of breath, chest pain, N/V/D, vaginal bleeding, vaginal discharge.   No diaphoresis.  No pallor.  Pulmonary: No increased work of breathing.  Speaks in full sentences without difficulty.  Lung sounds clear.  No tachypnea.  Cardiac: Normal rate and regular.   Abdominal: No abdominal tenderness. No peritoneal signs.  No rebound tenderness.  No guarding.  No CVA tenderness.    Clinical Course as of Aug 28 1256  Fri Aug 29, 2019  1660 Spoke with Victorino Dike, CNM at [facility]  We discussed the patient's symptoms.  She advises to start the patient's work-up here in our ED.   [SJ]  [id number] Atempted to call number listed in Amion for OB rapid response [phone]), but reached a busy signal.   [SJ]  1241 Rapid OB RN assessed the patient.  The patient told her she has not been having any abdominal discomfort.  She has been cleared from an OB standpoint.   [SJ]    Clinical Course User Index [SJ] Anselm Pancoast, PA-C    Vitals:   08/29/19 0931  BP: 129/69  Pulse: (!) 101  Resp: 16  Temp: (!) 100.9 F (38.3 C)  TempSrc: Oral  SpO2: 100%  Weight: 59 kg  Height: 5\' 3"  (1.6 m)     The patient appears stable so that the remainder of the MSE may be completed by another provider.   , PA-C 08/29/19 1119    08/31/19, PA-C 08/29/19 1258    08/31/19, MD 09/09/19 [phone]

## 2019-08-30 LAB — URINE CULTURE: Culture: NO GROWTH

## 2019-09-01 ENCOUNTER — Encounter: Payer: Medicaid Other | Admitting: Certified Nurse Midwife

## 2019-09-02 ENCOUNTER — Telehealth: Payer: Self-pay | Admitting: Nurse Practitioner

## 2019-09-02 NOTE — Telephone Encounter (Signed)
Called to discuss with [name] about Covid symptoms and the use of  bamlanivimab/etesevimab or casirivimab/imdevimab, a combination monoclonal antibody infusion for those with mild to moderate Covid symptoms and at a high risk of hospitalization.     Pt is qualified for this infusion at the [facility] infusion center due to co-morbid conditions and/or a member of an at-risk group. Patient is currently [redacted] weeks pregnant and uncertain of the risk of infusion. Will discuss further with OB for approval.   Last date she would be eligible for infusion is 09/04/19. Symptom onset 08/26/19.    Patient Active Problem List   Diagnosis Date Noted  . Supervision of other normal pregnancy, antepartum 05/12/2019    Willette Alma, AGPCNP-BC Pager: [phone] Amion: Thea Alken

## 2019-09-03 LAB — CULTURE, BLOOD (ROUTINE X 2)
Culture: NO GROWTH
Culture: NO GROWTH

## 2019-09-04 ENCOUNTER — Encounter: Payer: Medicaid Other | Admitting: Certified Nurse Midwife

## 2019-09-11 ENCOUNTER — Ambulatory Visit (INDEPENDENT_AMBULATORY_CARE_PROVIDER_SITE_OTHER): Payer: Medicaid Other | Admitting: Obstetrics and Gynecology

## 2019-09-11 ENCOUNTER — Encounter: Payer: Self-pay | Admitting: Obstetrics and Gynecology

## 2019-09-11 ENCOUNTER — Other Ambulatory Visit: Payer: Self-pay

## 2019-09-11 VITALS — BP 120/74 | HR 96 | Wt 133.1 lb

## 2019-09-11 DIAGNOSIS — Z3480 Encounter for supervision of other normal pregnancy, unspecified trimester: Secondary | ICD-10-CM

## 2019-09-11 DIAGNOSIS — Z3A25 25 weeks gestation of pregnancy: Secondary | ICD-10-CM

## 2019-09-11 DIAGNOSIS — Z3482 Encounter for supervision of other normal pregnancy, second trimester: Secondary | ICD-10-CM

## 2019-09-11 NOTE — Progress Notes (Signed)
Pt presents for ROB needs OB panel.

## 2019-09-11 NOTE — Patient Instructions (Signed)

## 2019-09-11 NOTE — Progress Notes (Signed)
Subjective:  Millenia Waldvogel is a 39 y.o. G2P1001 at [redacted]w[redacted]d being seen today for ongoing prenatal care.  She is currently monitored for the following issues for this low-risk pregnancy and has Supervision of other normal pregnancy, antepartum on their problem list.  Patient reports no complaints.  Contractions: Not present. Vag. Bleeding: None.  Movement: Present. Denies leaking of fluid.   The following portions of the patient's history were reviewed and updated as appropriate: allergies, current medications, past family history, past medical history, past social history, past surgical history and problem list. Problem list updated.  Objective:   Vitals:   09/11/19 1552  BP: 120/74  Pulse: 96  Weight: 133 lb 1.6 oz (60.4 kg)    Fetal Status: Fetal Heart Rate (bpm): 144   Movement: Present     General:  Alert, oriented and cooperative. Patient is in no acute distress.  Skin: Skin is warm and dry. No rash noted.   Cardiovascular: Normal heart rate noted  Respiratory: Normal respiratory effort, no problems with respiration noted  Abdomen: Soft, gravid, appropriate for gestational age. Pain/Pressure: Absent     Pelvic:  Cervical exam deferred        Extremities: Normal range of motion.  Edema: None  Mental Status: Normal mood and affect. Normal behavior. Normal judgment and thought content.   Urinalysis:      Assessment and Plan:  Pregnancy: G2P1001 at [redacted]w[redacted]d  1. Supervision of other normal pregnancy, antepartum Stable Glucola next visit - CBC/D/Plt+RPR+Rh+ABO+Rub Ab...  Preterm labor symptoms and general obstetric precautions including but not limited to vaginal bleeding, contractions, leaking of fluid and fetal movement were reviewed in detail with the patient. Please refer to After Visit Summary for other counseling recommendations.  Return in about 3 weeks (around 10/02/2019) for face to face, any provider, OB visit, fasting for glucola.   Hermina Staggers, MD

## 2019-09-12 LAB — CBC/D/PLT+RPR+RH+ABO+RUB AB...
Antibody Screen: NEGATIVE
Basophils Absolute: 0 10*3/uL (ref 0.0–0.2)
Basos: 0 %
EOS (ABSOLUTE): 0.6 10*3/uL — ABNORMAL HIGH (ref 0.0–0.4)
Eos: 6 %
HCV Ab: <0.1 s/co ratio (ref 0.0–0.9)
HIV Screen 4th Generation wRfx: NONREACTIVE
Hematocrit: 34.1 % (ref 34.0–46.6)
Hemoglobin: 11.3 g/dL (ref 11.1–15.9)
Hepatitis B Surface Ag: NEGATIVE
Immature Grans (Abs): 0.1 10*3/uL (ref 0.0–0.1)
Immature Granulocytes: 1 %
Lymphocytes Absolute: 2 10*3/uL (ref 0.7–3.1)
Lymphs: 20 %
MCH: 28.6 pg (ref 26.6–33.0)
MCHC: 33.1 g/dL (ref 31.5–35.7)
MCV: 86 fL (ref 79–97)
Monocytes Absolute: 0.8 10*3/uL (ref 0.1–0.9)
Monocytes: 7 %
Neutrophils Absolute: 6.8 10*3/uL (ref 1.4–7.0)
Neutrophils: 66 %
Platelets: 502 10*3/uL — ABNORMAL HIGH (ref 150–450)
RBC: 3.95 x10E6/uL (ref 3.77–5.28)
RDW: 14.5 % (ref 11.7–15.4)
RPR Ser Ql: NONREACTIVE
Rh Factor: POSITIVE
Rubella Antibodies, IGG: 10.8 index (ref >0.99)
WBC: 10.3 10*3/uL (ref 3.4–10.8)

## 2019-09-12 LAB — HCV INTERPRETATION

## 2019-10-02 ENCOUNTER — Ambulatory Visit (INDEPENDENT_AMBULATORY_CARE_PROVIDER_SITE_OTHER): Payer: Medicaid Other | Admitting: Obstetrics & Gynecology

## 2019-10-02 ENCOUNTER — Other Ambulatory Visit: Payer: Medicaid Other

## 2019-10-02 ENCOUNTER — Other Ambulatory Visit: Payer: Self-pay

## 2019-10-02 VITALS — BP 101/64 | HR 78 | Wt 133.7 lb

## 2019-10-02 DIAGNOSIS — U071 COVID-19: Secondary | ICD-10-CM

## 2019-10-02 DIAGNOSIS — Z3A28 28 weeks gestation of pregnancy: Secondary | ICD-10-CM

## 2019-10-02 DIAGNOSIS — Z3480 Encounter for supervision of other normal pregnancy, unspecified trimester: Secondary | ICD-10-CM

## 2019-10-02 DIAGNOSIS — Z3483 Encounter for supervision of other normal pregnancy, third trimester: Secondary | ICD-10-CM

## 2019-10-02 DIAGNOSIS — Z23 Encounter for immunization: Secondary | ICD-10-CM | POA: Diagnosis not present

## 2019-10-02 DIAGNOSIS — Z8616 Personal history of COVID-19: Secondary | ICD-10-CM

## 2019-10-02 NOTE — Patient Instructions (Addendum)
Return to office for any scheduled appointments. Call the office or go to the [facility] at [facility] if:  You begin to have strong, frequent contractions  Your water breaks.  Sometimes it is a big gush of fluid, sometimes it is just a trickle that keeps getting your panties wet or running down your legs  You have vaginal bleeding.  It is normal to have a small amount of spotting if your cervix was checked.   You do not feel your baby moving like normal.  If you do not, get something to eat and drink and lay down and focus on feeling your baby move.   If your baby is still not moving like normal, you should call the office or go to MAU.  Any other obstetric concerns.   Third Trimester of Pregnancy The third trimester is from week 28 through week 40 (months 7 through 9). The third trimester is a time when the unborn baby (fetus) is growing rapidly. At the end of the ninth month, the fetus is about 20 inches in length and weighs 6-10 pounds. Body changes during your third trimester Your body will continue to go through many changes during pregnancy. The changes vary from woman to woman. During the third trimester:  Your weight will continue to increase. You can expect to gain 25-35 pounds (11-16 kg) by the end of the pregnancy.  You may begin to get stretch marks on your hips, abdomen, and breasts.  You may urinate more often because the fetus is moving lower into your pelvis and pressing on your bladder.  You may develop or continue to have heartburn. This is caused by increased hormones that slow down muscles in the digestive tract.  You may develop or continue to have constipation because increased hormones slow digestion and cause the muscles that push waste through your intestines to relax.  You may develop hemorrhoids. These are swollen veins (varicose veins) in the rectum that can itch or be painful.  You may develop swollen, bulging veins (varicose veins)  in your legs.  You may have increased body aches in the pelvis, back, or thighs. This is due to weight gain and increased hormones that are relaxing your joints.  You may have changes in your hair. These can include thickening of your hair, rapid growth, and changes in texture. Some women also have hair loss during or after pregnancy, or hair that feels dry or thin. Your hair will most likely return to normal after your baby is born.  Your breasts will continue to grow and they will continue to become tender. A yellow fluid (colostrum) may leak from your breasts. This is the first milk you are producing for your baby.  Your belly button may stick out.  You may notice more swelling in your hands, face, or ankles.  You may have increased tingling or numbness in your hands, arms, and legs. The skin on your belly may also feel numb.  You may feel short of breath because of your expanding uterus.  You may have more problems sleeping. This can be caused by the size of your belly, increased need to urinate, and an increase in your body's metabolism.  You may notice the fetus "dropping," or moving lower in your abdomen (lightening).  You may have increased vaginal discharge.  You may notice your joints feel loose and you may have pain around your pelvic bone. What to expect at prenatal visits You will have   prenatal exams every 2 weeks until week 36. Then you will have weekly prenatal exams. During a routine prenatal visit:  You will be weighed to make sure you and the baby are growing normally.  Your blood pressure will be taken.  Your abdomen will be measured to track your baby's growth.  The fetal heartbeat will be listened to.  Any test results from the previous visit will be discussed.  You may have a cervical check near your due date to see if your cervix has softened or thinned (effaced).  You will be tested for Group B streptococcus. This happens between 35 and 37 weeks. Your  health care provider may ask you:  What your birth plan is.  How you are feeling.  If you are feeling the baby move.  If you have had any abnormal symptoms, such as leaking fluid, bleeding, severe headaches, or abdominal cramping.  If you are using any tobacco products, including cigarettes, chewing tobacco, and electronic cigarettes.  If you have any questions. Other tests or screenings that may be performed during your third trimester include:  Blood tests that check for low iron levels (anemia).  Fetal testing to check the health, activity level, and growth of the fetus. Testing is done if you have certain medical conditions or if there are problems during the pregnancy.  Nonstress test (NST). This test checks the health of your baby to make sure there are no signs of problems, such as the baby not getting enough oxygen. During this test, a belt is placed around your belly. The baby is made to move, and its heart rate is monitored during movement. What is false labor? False labor is a condition in which you feel small, irregular tightenings of the muscles in the womb (contractions) that usually go away with rest, changing position, or drinking water. These are called Braxton Hicks contractions. Contractions may last for hours, days, or even weeks before true labor sets in. If contractions come at regular intervals, become more frequent, increase in intensity, or become painful, you should see your health care provider. What are the signs of labor?  Abdominal cramps.  Regular contractions that start at 10 minutes apart and become stronger and more frequent with time.  Contractions that start on the top of the uterus and spread down to the lower abdomen and back.  Increased pelvic pressure and dull back pain.  A watery or bloody mucus discharge that comes from the vagina.  Leaking of amniotic fluid. This is also known as your "water breaking." It could be a slow trickle or a gush.  Let your health care provider know if it has a color or strange odor. If you have any of these signs, call your health care provider right away, even if it is before your due date. Follow these instructions at home: Medicines  Follow your health care provider's instructions regarding medicine use. Specific medicines may be either safe or unsafe to take during pregnancy.  Take a prenatal vitamin that contains at least 600 micrograms (mcg) of folic acid.  If you develop constipation, try taking a stool softener if your health care provider approves. Eating and drinking   Eat a balanced diet that includes fresh fruits and vegetables, whole grains, good sources of protein such as meat, eggs, or tofu, and low-fat dairy. Your health care provider will help you determine the amount of weight gain that is right for you.  Avoid raw meat and uncooked cheese. These carry germs that   can cause birth defects in the baby.  If you have low calcium intake from food, talk to your health care provider about whether you should take a daily calcium supplement.  Eat four or five small meals rather than three large meals a day.  Limit foods that are high in fat and processed sugars, such as fried and sweet foods.  To prevent constipation: ? Drink enough fluid to keep your urine clear or pale yellow. ? Eat foods that are high in fiber, such as fresh fruits and vegetables, whole grains, and beans. Activity  Exercise only as directed by your health care provider. Most women can continue their usual exercise routine during pregnancy. Try to exercise for 30 minutes at least 5 days a week. Stop exercising if you experience uterine contractions.  Avoid heavy lifting.  Do not exercise in extreme heat or humidity, or at high altitudes.  Wear low-heel, comfortable shoes.  Practice good posture.  You may continue to have sex unless your health care provider tells you otherwise. Relieving pain and  discomfort  Take frequent breaks and rest with your legs elevated if you have leg cramps or low back pain.  Take warm sitz baths to soothe any pain or discomfort caused by hemorrhoids. Use hemorrhoid cream if your health care provider approves.  Wear a good support bra to prevent discomfort from breast tenderness.  If you develop varicose veins: ? Wear support pantyhose or compression stockings as told by your healthcare provider. ? Elevate your feet for 15 minutes, 3-4 times a day. Prenatal care  Write down your questions. Take them to your prenatal visits.  Keep all your prenatal visits as told by your health care provider. This is important. Safety  Wear your seat belt at all times when driving.  Make a list of emergency phone numbers, including numbers for family, friends, the hospital, and police and fire departments. General instructions  Avoid cat litter boxes and soil used by cats. These carry germs that can cause birth defects in the baby. If you have a cat, ask someone to clean the litter box for you.  Do not travel far distances unless it is absolutely necessary and only with the approval of your health care provider.  Do not use hot tubs, steam rooms, or saunas.  Do not drink alcohol.  Do not use any products that contain nicotine or tobacco, such as cigarettes and e-cigarettes. If you need help quitting, ask your health care provider.  Do not use any medicinal herbs or unprescribed drugs. These chemicals affect the formation and growth of the baby.  Do not douche or use tampons or scented sanitary pads.  Do not cross your legs for long periods of time.  To prepare for the arrival of your baby: ? Take prenatal classes to understand, practice, and ask questions about labor and delivery. ? Make a trial run to the hospital. ? Visit the hospital and tour the maternity area. ? Arrange for maternity or paternity leave through employers. ? Arrange for family and  friends to take care of pets while you are in the hospital. ? Purchase a rear-facing car seat and make sure you know how to install it in your car. ? Pack your hospital bag. ? Prepare the baby's nursery. Make sure to remove all pillows and stuffed animals from the baby's crib to prevent suffocation.  Visit your dentist if you have not gone during your pregnancy. Use a soft toothbrush to brush your teeth and be   gentle when you floss. Contact a health care provider if:  You are unsure if you are in labor or if your water has broken.  You become dizzy.  You have mild pelvic cramps, pelvic pressure, or nagging pain in your abdominal area.  You have lower back pain.  You have persistent nausea, vomiting, or diarrhea.  You have an unusual or bad smelling vaginal discharge.  You have pain when you urinate. Get help right away if:  Your water breaks before 37 weeks.  You have regular contractions less than 5 minutes apart before 37 weeks.  You have a fever.  You are leaking fluid from your vagina.  You have spotting or bleeding from your vagina.  You have severe abdominal pain or cramping.  You have rapid weight loss or weight gain.  You have shortness of breath with chest pain.  You notice sudden or extreme swelling of your face, hands, ankles, feet, or legs.  Your baby makes fewer than 10 movements in 2 hours.  You have severe headaches that do not go away when you take medicine.  You have vision changes. Summary  The third trimester is from week 28 through week 40, months 7 through 9. The third trimester is a time when the unborn baby (fetus) is growing rapidly.  During the third trimester, your discomfort may increase as you and your baby continue to gain weight. You may have abdominal, leg, and back pain, sleeping problems, and an increased need to urinate.  During the third trimester your breasts will keep growing and they will continue to become tender. A yellow  fluid (colostrum) may leak from your breasts. This is the first milk you are producing for your baby.  False labor is a condition in which you feel small, irregular tightenings of the muscles in the womb (contractions) that eventually go away. These are called Braxton Hicks contractions. Contractions may last for hours, days, or even weeks before true labor sets in.  Signs of labor can include: abdominal cramps; regular contractions that start at 10 minutes apart and become stronger and more frequent with time; watery or bloody mucus discharge that comes from the vagina; increased pelvic pressure and dull back pain; and leaking of amniotic fluid. This information is not intended to replace advice given to you by your health care provider. Make sure you discuss any questions you have with your health care provider. Document Revised: 07/18/2018 Document Reviewed: 05/02/2016 Elsevier Patient Education  2020 Elsevier Inc.     AREA PEDIATRIC/FAMILY PRACTICE PHYSICIANS  [place] ([address]) . [facility] o Chambliss, MD; Eniola, MD; Hale, MD; Hensel, MD; McDiarmid, MD; McIntyer, MD; Neal, MD; Walden, MD o [address] [place], [place] [address] o [phone] o Mon-Fri 8:30-12:30, 1:30-5:00 o Providers come to see babies at [facility] o Accepting Medicaid . [facility] o Limited providers who accept newborns: Koirala, MD; Morrow, MD; Wolters, MD o [address], [place], [place] [address] o [phone] o Mon-Fri 8:00-5:30 o Babies seen by providers at [facility] o Does NOT accept Medicaid o Please call early in hospitalization for appointment (limited availability)  . [facility] o Mulberry, MD o [address] [place], [place] [address] o [phone] o Mon, Tue, Thur, Fri 8:30-5:00, Wed 10:00-7:00 (closed 1-2pm) o Babies seen by [facility] providers o Accepting  Medicaid . [facility] o Rubin, MD o [address], [place], [place]   [address] o [phone] o Mon-Fri 8:30-5:00, Sat 8:30-12:00 o Provider comes to see babies at [facility] o Accepting Medicaid o Must have been referred from current patients or contacted office prior to delivery . [facility] ([facility]) o Brown, MD; Chandler, MD; Ettefagh, MD; Grant, MD; Lester, MD; McCormick, MD; McQueen, MD; Prose, MD; Simha, MD; Stanley, MD; Stryffeler, NP; Tebben, NP o [address], [place], [place] [address] o [phone] o Mon, Tue, Thur, Fri 8:30-5:30, Wed 9:30-5:30, Sat 8:30-12:30 o Babies seen by [facility] providers o Accepting Medicaid o Only accepting infants of first-time parents or siblings of current patients o Hospital discharge coordinator will make follow-up appointment . Jack Amos o [address], [place], [place]  [address] o [phone]   Fax - [phone] . [facility] o [address], [place], [place]  [address] o Phone - [phone]   Fax - [phone] . Shilpa Gosrani o [address], [place], [place]  [address] o [phone]  [place] ([address]) . [facility] o Bates, MD; [place]assfield, MD; Cooper, Cox, MD; MD; Davis, MD; Dovico, MD; Ettefaugh, MD; Little, MD; Lowe, MD; Keiffer, MD; Melvin, MD; Sumner, MD; Williams, MD o [address], [place], [place] [address] o [phone] o Mon-Fri 8:30-5:00 (extended evenings Mon-Thur as needed), Sat-Sun 10:00-1:00 o Providers come to see babies at [facility] o Accepting Medicaid for families of first-time babies and families with all children in the household age 3 and under. Must register with office prior to making appointment (M-F only). . [facility] o Henson, NP; Knapp, MD; Lalonde, MD; Tysinger, PA o [address] [place], [place]  [address] o [phone] o Mon-Fri 8:00-5:00 o Babies seen by providers at [facility] o Does NOT accept Medicaid/Commercial Insurance Only . [facility] ([facility])  o Artis, MD; Barnes, MD; Bratton, MD; Coccaro, MD; [place] Gardner, MD; Kramer, MD; Marshall, MD; Netherton, MD; Poleto, MD; Skinner, MD o [address] [place], [place] [address] o [phone] o Mon-Fri 8:30-5:30, Sat (Oct.-Mar.) 9:00-1:00 o Babies seen by providers at [facility] o Accepting Medicaid  [place] ([address]) . [facility] o Reid, MD; Warner, MD o [address], [place], [place] [address] o [phone] o Mon-Fri 8:30-5:00, Sat 8:30-12:00 o Providers come to see babies at [facility] o Does NOT accept Medicaid . [facility] o Becker, PA; Hagler, MD; Scifres, PA; Sun, MD; Swayne, MD o [address], [place], [place] [address] o [phone] o Mon-Fri 8:00-5:00 o Babies seen by providers at [facility] o Does NOT accept Medicaid o Only accepting babies of parents who are patients o Please call early in hospitalization for appointment (limited availability) . [facility] o Clark, MD; Frye, MD; Kelleher, MD; Mack, NP; Miller, MD; O'Keller, MD; Patterson, NP; Pudlo, MD; Puzio, MD; Thomas, MD; Tucker, MD; Twiselton, MD o [address], [place], [place] [address] o [phone] o Mon-Fri 8:00-5:00, Sat 9:00-12:00 o Providers come to see babies at [facility] o Does NOT accept Medicaid  [place] ([address]) . [facility] o Limited providers accepting new patients: Brake, NP; Wharton, PA o [address], [place], [place] [address] o [phone] o Mon-Fri 8:00-5:00 o Babies seen by providers at [facility] o Does NOT accept Medicaid o Only accepting babies of   parents who are  patients o Please call early in hospitalization for appointment (limited availability) . [facility] o Gay, MD; Quinlan, MD o [address] [place], [place] [address] o [phone] (press 1 to schedule appointment) o Mon-Fri 8:00-5:00 o Providers come to see babies at [facility] o Does NOT accept Medicaid . [facility] o Mazer, MD o [address] [place], [place] [address] o [phone] o Mon-Fri 8:30-5:00 (lunch 12:30-1:00), extended hours by appointment only Wed 5:00-6:30 o Babies seen by [facility] providers o Accepting Medicaid . [facility] o Banks, MD; Jordan, MD; Koberlein, MD o [address], [place], [place] [address] o [phone] o Mon-Fri 8:00-5:00 o Babies seen by [facility] providers o Does NOT accept Medicaid . [facility] o Parker, MD; Hunter, MD; Wallace, DO o [address] [place], [place] [address] o [phone] o Mon-Fri 8:00-5:00 o Babies seen by [facility] providers o Does NOT accept Medicaid . [facility] o Brandon, PA; Brecken, PA; Christy, NP; Dees, MD; DeClaire, MD; DeWeese, MD; Hansen, NP; Mills, NP; Parrish, NP; Smoot, NP; Summer, MD; Vapne, MD o [address] [place], [place] [address] o [phone] o Mon-Fri 8:30-5:00, Sat 10:00-1:00 o Providers come to see babies at [facility] o Does NOT accept Medicaid o Free prenatal information session Tuesdays at 4:45pm . [facility] o Bouska, MD; Gordon, PA; Jeffery, PA; Weber, PA o [address] [place] [address] o [phone] o Mon-Fri 7:30-5:30 o Babies seen by [facility] providers . [facility] o [address], [place], [place]  [address] o [phone]   Fax - [phone]  [place] ([address]) . [facility] o [place], [place] o [address] [place], [place]  [address] o [phone] o Mon-Thur 8:00-6:00 o Providers come to see babies at [facility] o Accepting Medicaid . [facility] o Anderson, NP; Badger, MD; Beal, PA; Spencer, PA o [address] [place], [place] [address] o [phone] o Mon-Thur 7:30-7:30, Fri 7:30-4:30 o Babies seen by [facility] providers o Accepting Medicaid . [facility] o Agbuya, MD; Klett, NP; Romgoolam, MD o [address], [place], [place] [address] o [phone] o Mon-Fri 8:30-5:00, Sat 8:30-12:00 o Providers come to see babies at [facility] o Accepting Medicaid o Must have "Meet & Greet" appointment at office prior to delivery . [facility] ([facility] o McCord, MD; Wallace, MD; Wood, MD o [address], [place], [place] [address] o [phone] o Mon-Wed 8:00-6:00, Thur-Fri 8:00-5:00, Sat 9:00-12:00 o Providers come to see babies at [facility] o Does NOT accept Medicaid o Only accepting siblings of current patients . [facility]  o [address], [place], [place]  [address] o [phone]   Fax - [phone] . [facility] o [address], [place], [place]  [address] o [phone]   Fax - [phone]  [place] ([address] & [address]) . [facility] o Cirigliano, DO; Matthews, DO o [address] , [place] [address] o [phone] o Mon-Fri 7:00-5:00 o Babies seen by [facility] providers o Does NOT accept Medicaid . [facility] o Briscoe, MD; Howley, PA; Moreira, PA o [address], [place], [place] [address] o [phone] o Mon-Fri 8:00-5:00 o Babies seen by [facility]   providers o Accepting Medicaid . [facility] o Boyd, MD; Church, PA; Jones, NP; Osborn,  PA o [address], [place], [place] [address] o [phone] o Mon-Fri 8:00-5:00 o Babies seen by providers at [facility] o Accepting Medicaid  [place] ([address]) . [facility] [place] o Wendling, DO o [address] [place], [place] [address] o [phone] o Mon-Fri 8:00-5:00 o Babies seen by [facility] providers o Does NOT accept Medicaid o Limited availability, please call early in hospitalization to schedule follow-up . [facility] o Calderon, PA; Cummings, MD; Dillard, MD; Martin, PA; Olson, MD; VanDeven, PA o [address], [place], [place] [address] o [phone] o Mon-Fri 8:30-5:00, Sat 9:00-12:00 o Babies seen by providers at [facility] o Accepting Medicaid o Please register online then schedule online or call office o [email or web address] . [facility] ([facility] o Hunter, NP; Kumar, MD; Martin Rogers, PA o [address], [place], [place] [address] o [phone] o Mon-Fri 8:00-5:00 o Babies seen by providers at [facility] o Accepting Medicaid . [facility] ([facility]) o [place], MD; Kristi Fleenor, NP; West, MD o [address], [place], [place] [address] o [phone] o Mon-Fri 8:00-5:30, Sat&Sun by appointment (phones open at 8:30) o Babies seen by [facility] providers o Accepting Medicaid o Must be a first-time baby or sibling of current patient . [facility] [place]  o [address], [place], [place]  [address] o [phone]   Fax - [phone]  [place] ([address]) . [facility] o Brown, PA; Cowen, PA; Rice, MD; Helton, PA; Spry, MD o [address] [place], [place] [address] o [phone] o Mon-Thur 8:00-7:00, Fri 8:00-5:00, Sat 8:00-12:00, Sun 9:00-12:00 o [facility] by [facility] o Accepting Medicaid . [facility] o Coe-Goins, MD; Marshall, MD; Pierre-Louis, MD o [address], [place], [place] [address] o [phone] o Mon-Thur 8:00-5:00 o Babies seen by providers at [facility] o Accepting Medicaid . [facility] o Bratton, MD; Coe-Goins, MD; Hayes, MD; Lewis, MD; List, MD; Lott, MD; Marshall, MD; Moran, MD; O'Neal, MD; Pierre-Louis, MD; Pitonzo, MD; Scholer, MD; Spangle, MD o [address] [place], [place] [address] o [phone] o Mon-Fri 8:00-5:30, Sat (Oct.-Mar.) 9:00-1:00 o Babies seen by providers at [facility] o Accepting Medicaid o Must fill out new patient packet, available online at [email or web address] . [facility] [address] ([facility] [address]) o Friddle, NP; Harris, NP; Kelly, NP; Logan, MD; Melvin, PA; Poth, MD; Ramadoss, MD; Stanton, NP o [address], [place], [place] [address] o [phone] o Mon-Thur 8:00-5:30, Fri 8:00-5:00 o Babies seen by providers at [facility] o Accepting Medicaid  [place] ([id number]) . [facility] o Dixon, PA; [place], MD; Pickard, MD; Tapia, PA o [address], [place], [place] [address] o [phone] o Mon-Fri 8:00-5:00 o Babies seen by providers at [facility] o Accepting Medicaid   [place] ([address]) . [facility] [place] o Masneri, DO; Meyers, MD; Nelson, PA o [address], [place], [place] [address] o [phone] o Mon-Fri 8:00-5:00 o Babies   seen by providers at [facility] o Does NOT accept Medicaid o Limited appointment availability, please call early in hospitalization  . [facility] [place] o Kunedd, DO; McGowen, MD o [address], [place], [place] [address] o [phone] o Mon-Fri 8:00-5:00 o Babies seen by [facility] providers o Does NOT accept Medicaid . [facility] [place] o Cameron, MD; MacDonald, MD; Michaels, PA; Nayak, MD o [address] [place] [address], [place], [place] [address] o [phone] o Mon-Fri 8:00-5:00 o After hours clinic ([address] [place], [place] [address]) [phone] Mon-Fri 5:00-8:00, Sat 12:00-6:00, Sun 10:00-4:00 o Babies seen by [facility] providers o Accepting Medicaid . [facility] [place] o [address], [place], [place]  [address] o [phone]   Fax - [phone]  [place] ([address]) . [facility] [place] o Andy, MD o [address], [place], [place] [address] o [phone] o Mon-Fri 8:00-5:00 o Babies seen by [facility] providers o Does NOT accept Medicaid . [facility] [place] ([facility] [place]) o Eksir, MD o [address], [place], [place] [address] o [phone] o Mon-Thur 8:00-7:00, Fri 8:00-5:00, Sat 8:00-12:00 o Babies seen by providers at [facility] o Accepting Medicaid - but does not have vaccinations in office (must be received elsewhere) o Limited availability, please call early in hospitalization  [place] ([id number]) . [facility]  o Charlene Flemming, MD o [address], [place] [address] o [phone]  Fax [phone]    

## 2019-10-02 NOTE — Progress Notes (Signed)
   PRENATAL VISIT NOTE  Subjective:  Jessica Ewing is a 39 y.o. G2P1001 at [redacted]w[redacted]d being seen today for ongoing prenatal care.  She is currently monitored for the following issues for this low-risk pregnancy and has Supervision of other normal pregnancy, antepartum and COVID-19 affecting pregnancy in second trimester on their problem list.  Patient reports no complaints. Had COVID infection last month, mild symptoms. Whole family had it. No lingering effects.   Contractions: Not present. Vag. Bleeding: None.  Movement: Present. Denies leaking of fluid.   The following portions of the patient's history were reviewed and updated as appropriate: allergies, current medications, past family history, past medical history, past social history, past surgical history and problem list.   Objective:   Vitals:   10/02/19 0908  BP: 101/64  Pulse: 78  Weight: 133 lb 11.2 oz (60.6 kg)    Fetal Status: Fetal Heart Rate (bpm): 140 Fundal Height: 28 cm Movement: Present     General:  Alert, oriented and cooperative. Patient is in no acute distress.  Skin: Skin is warm and dry. No rash noted.   Cardiovascular: Normal heart rate noted  Respiratory: Normal respiratory effort, no problems with respiration noted  Abdomen: Soft, gravid, appropriate for gestational age.  Pain/Pressure: Absent     Pelvic: Cervical exam deferred        Extremities: Normal range of motion.  Edema: None  Mental Status: Normal mood and affect. Normal behavior. Normal judgment and thought content.   Assessment and Plan:  Pregnancy: G2P1001 at [redacted]w[redacted]d 1. COVID-19 affecting pregnancy in second trimester No effects.  Continue close monitoring. Declines vaccine during pregnancy, will get postpartum.  2. Need for diphtheria-tetanus-pertussis (Tdap) vaccine - Tdap vaccine greater than or equal to 7yo IM  3. [redacted] weeks gestation of pregnancy 4. Supervision of other normal pregnancy, antepartum Third trimester labs today, will follow up  results and manage accordingly. - Glucose Tolerance, 2 Hours w/1 Hour - CBC - RPR - HIV Antibody (routine testing w rflx) Preterm labor symptoms and general obstetric precautions including but not limited to vaginal bleeding, contractions, leaking of fluid and fetal movement were reviewed in detail with the patient. Please refer to After Visit Summary for other counseling recommendations.   Return in about 2 weeks (around 10/16/2019) for OFFICE OB Visit.  No future appointments.  Jaynie Collins, MD

## 2019-10-03 LAB — CBC
Hematocrit: 33.8 % — ABNORMAL LOW (ref 34.0–46.6)
Hemoglobin: 11.1 g/dL (ref 11.1–15.9)
MCH: 28.5 pg (ref 26.6–33.0)
MCHC: 32.8 g/dL (ref 31.5–35.7)
MCV: 87 fL (ref 79–97)
Platelets: 349 10*3/uL (ref 150–450)
RBC: 3.9 x10E6/uL (ref 3.77–5.28)
RDW: 13.9 % (ref 11.7–15.4)
WBC: 11.8 10*3/uL — ABNORMAL HIGH (ref 3.4–10.8)

## 2019-10-03 LAB — HIV ANTIBODY (ROUTINE TESTING W REFLEX): HIV Screen 4th Generation wRfx: NONREACTIVE

## 2019-10-03 LAB — GLUCOSE TOLERANCE, 2 HOURS W/ 1HR
Glucose, 1 hour: 123 mg/dL (ref 65–179)
Glucose, 2 hour: 112 mg/dL (ref 65–152)
Glucose, Fasting: 88 mg/dL (ref 65–91)

## 2019-10-03 LAB — RPR: RPR Ser Ql: NONREACTIVE

## 2019-10-09 DIAGNOSIS — Z419 Encounter for procedure for purposes other than remedying health state, unspecified: Secondary | ICD-10-CM | POA: Diagnosis not present

## 2019-10-17 ENCOUNTER — Ambulatory Visit (INDEPENDENT_AMBULATORY_CARE_PROVIDER_SITE_OTHER): Payer: Medicaid Other | Admitting: Obstetrics and Gynecology

## 2019-10-17 ENCOUNTER — Encounter: Payer: Self-pay | Admitting: Obstetrics and Gynecology

## 2019-10-17 ENCOUNTER — Other Ambulatory Visit: Payer: Self-pay

## 2019-10-17 VITALS — BP 130/77 | HR 99 | Wt 136.0 lb

## 2019-10-17 DIAGNOSIS — Z3483 Encounter for supervision of other normal pregnancy, third trimester: Secondary | ICD-10-CM

## 2019-10-17 DIAGNOSIS — Z3480 Encounter for supervision of other normal pregnancy, unspecified trimester: Secondary | ICD-10-CM

## 2019-10-17 DIAGNOSIS — Z3A30 30 weeks gestation of pregnancy: Secondary | ICD-10-CM

## 2019-10-17 NOTE — Patient Instructions (Signed)
HOW TO TAKE YOUR BLOOD PRESSURE AT HOME  Take your blood pressure at home. Pick one day a week and take your blood pressure consistently every week on this day. Relax quietly for about 15 minutes then take your blood pressure. DO NOT take it when you are upset, stressed, or have just been active. Make sure your blood pressure cuff fits appropriately, there should be measurements on the box and on the cuff to help guide you.   Once you have taken your blood pressure, look at the numbers. If the top number (systolic) is equal to or above 140, please call the office and let us know. If the bottom number (diastolic) is equal to or above 90, please call the office and let us know.   If you have any questions or are concerned you are not taking your blood pressure correctly, please call the office!  If you have a headache that does not improve, problems with your vision, abdominal pain or other concerns, please call and let us know. Please go to the hospital with any severe symptoms. Please go to the hospital for labor, painful contractions every 5 minutes or less, leaking of fluid, or if you have not felt normal fetal movement.

## 2019-10-17 NOTE — Progress Notes (Signed)
° °  PRENATAL VISIT NOTE  Subjective:  Jessica Ewing is a 39 y.o. G2P1001 at [redacted]w[redacted]d being seen today for ongoing prenatal care.  She is currently monitored for the following issues for this low-risk pregnancy and has Supervision of other normal pregnancy, antepartum and COVID-19 affecting pregnancy in second trimester on their problem list.  Patient reports no complaints.  Contractions: Not present. Vag. Bleeding: None.  Movement: Present. Denies leaking of fluid.   The following portions of the patient's history were reviewed and updated as appropriate: allergies, current medications, past family history, past medical history, past social history, past surgical history and problem list.   Objective:   Vitals:   10/17/19 1021  BP: 130/77  Pulse: 99  Weight: 136 lb (61.7 kg)    Fetal Status:   Fundal Height: 30 cm Movement: Present     General:  Alert, oriented and cooperative. Patient is in no acute distress.  Skin: Skin is warm and dry. No rash noted.   Cardiovascular: Normal heart rate noted  Respiratory: Normal respiratory effort, no problems with respiration noted  Abdomen: Soft, gravid, appropriate for gestational age.  Pain/Pressure: Absent     Pelvic: Cervical exam deferred        Extremities: Normal range of motion.  Edema: None  Mental Status: Normal mood and affect. Normal behavior. Normal judgment and thought content.   Assessment and Plan:  Pregnancy: G2P1001 at [redacted]w[redacted]d  1. Supervision of other normal pregnancy, antepartum No issues  Preterm labor symptoms and general obstetric precautions including but not limited to vaginal bleeding, contractions, leaking of fluid and fetal movement were reviewed in detail with the patient. Please refer to After Visit Summary for other counseling recommendations.   Return in about 2 weeks (around 10/31/2019) for low OB, in person.  Future Appointments  Date Time Provider Department Center  10/31/2019  9:45 AM Warden Fillers, MD  [facility] None    Conan Bowens, MD

## 2019-10-31 ENCOUNTER — Other Ambulatory Visit: Payer: Self-pay

## 2019-10-31 ENCOUNTER — Ambulatory Visit (INDEPENDENT_AMBULATORY_CARE_PROVIDER_SITE_OTHER): Payer: Medicaid Other | Admitting: Obstetrics and Gynecology

## 2019-10-31 VITALS — BP 123/75 | HR 75 | Wt 136.2 lb

## 2019-10-31 DIAGNOSIS — Z3483 Encounter for supervision of other normal pregnancy, third trimester: Secondary | ICD-10-CM

## 2019-10-31 DIAGNOSIS — Z3480 Encounter for supervision of other normal pregnancy, unspecified trimester: Secondary | ICD-10-CM

## 2019-10-31 DIAGNOSIS — Z3A32 32 weeks gestation of pregnancy: Secondary | ICD-10-CM

## 2019-10-31 NOTE — Progress Notes (Signed)
   PRENATAL VISIT NOTE  Subjective:  Jessica Ewing is a 39 y.o. G2P1001 at [redacted]w[redacted]d being seen today for ongoing prenatal care.  She is currently monitored for the following issues for this low-risk pregnancy and has Supervision of other normal pregnancy, antepartum and COVID-19 affecting pregnancy in second trimester on their problem list.  Patient doing well with no acute concerns today. She reports backache and increased pressure while standing, it is beginning to interfere with work.  Contractions: Not present. Vag. Bleeding: None.  Movement: Present. Denies leaking of fluid.   Nursing staff was concerned because she felt she heard a dip to 117 with immediate recovery.  NST was ordered  The following portions of the patient's history were reviewed and updated as appropriate: allergies, current medications, past family history, past medical history, past social history, past surgical history and problem list. Problem list updated.  Objective:   Vitals:   10/31/19 0953  BP: 123/75  Pulse: 75  Weight: 136 lb 3.2 oz (61.8 kg)   NST: baseline 125, moderate variability, reactive, cat 1 strip, multiple accels, no decels, duration: 25 minutes Toco: 3 ctx noted, irregular pattern, pt does not feel and is comfortable Fetal Status: Fetal Heart Rate (bpm): 123   Movement: Present     General:  Alert, oriented and cooperative. Patient is in no acute distress.  Skin: Skin is warm and dry. No rash noted.   Cardiovascular: Normal heart rate noted  Respiratory: Normal respiratory effort, no problems with respiration noted  Abdomen: Soft, gravid, appropriate for gestational age.  Pain/Pressure: Absent     Pelvic: Cervical exam deferred        Extremities: Normal range of motion.  Edema: None  Mental Status:  Normal mood and affect. Normal behavior. Normal judgment and thought content.   Assessment and Plan:  Pregnancy: G2P1001 at [redacted]w[redacted]d  1. Supervision of other normal pregnancy, antepartum Pt given  labor precautions, if more than 4 tightenings or abdominal pain in 1 hour, pt advised to go to MAU Pt advised to increase hydration, she drank 2- 4 glasses of water yesterday. Preterm labor symptoms and general obstetric precautions including but not limited to vaginal bleeding, contractions, leaking of fluid and fetal movement were reviewed in detail with the patient.  Please refer to After Visit Summary for other counseling recommendations.   Return in about 2 weeks (around 11/14/2019) for ROB, in person.   Mariel Aloe, MD

## 2019-11-05 ENCOUNTER — Telehealth: Payer: Self-pay | Admitting: *Deleted

## 2019-11-05 NOTE — Telephone Encounter (Signed)
Late entry:  Spoke with pt yesterday regarding work and LOA.  Pt made aware that she will need to discuss work with a provider to determine when she can start her LOA. Pt was advised to discuss with her employer to know what forms they will need for her LOA -- FMLA vs. Letter. 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  Pt states that she is just having more pelvic pain while working.    Pt advised to contact office once she has appropriate paperwork and we can message provider.   Pt states understanding.

## 2019-11-07 ENCOUNTER — Encounter: Payer: Self-pay | Admitting: Obstetrics

## 2019-11-07 ENCOUNTER — Telehealth (INDEPENDENT_AMBULATORY_CARE_PROVIDER_SITE_OTHER): Payer: Medicaid Other | Admitting: Obstetrics

## 2019-11-07 DIAGNOSIS — Z3483 Encounter for supervision of other normal pregnancy, third trimester: Secondary | ICD-10-CM

## 2019-11-07 DIAGNOSIS — Z3480 Encounter for supervision of other normal pregnancy, unspecified trimester: Secondary | ICD-10-CM

## 2019-11-07 NOTE — Progress Notes (Signed)
Patient presents for ROB via mychart. Patient identified with 2 patient identifiers. Her bp today is 128/71. Patient has no other concerns.

## 2019-11-07 NOTE — Progress Notes (Signed)
   OBSTETRICS PRENATAL VIRTUAL VISIT ENCOUNTER NOTE  Provider location: Center for [facility]   I connected with Jessica Ewing on 11/07/19 at  8:30 AM EDT by MyChart Video Encounter at home and verified that I am speaking with the correct person using two identifiers.   I discussed the limitations, risks, security and privacy concerns of performing an evaluation and management service virtually and the availability of in person appointments. I also discussed with the patient that there may be a patient responsible charge related to this service. The patient expressed understanding and agreed to proceed. Subjective:  Jessica Ewing is a 39 y.o. G2P1001 at [redacted]w[redacted]d being seen today for ongoing prenatal care.  She is currently monitored for the following issues for this low-risk pregnancy and has Supervision of other normal pregnancy, antepartum and COVID-19 affecting pregnancy in second trimester on their problem list.  Patient reports no complaints.  Contractions: Irritability. Vag. Bleeding: None.  Movement: Present. Denies any leaking of fluid.   The following portions of the patient's history were reviewed and updated as appropriate: allergies, current medications, past family history, past medical history, past social history, past surgical history and problem list.   Objective:   Vitals:   11/07/19 0831  BP: 128/71  Pulse: 77    Fetal Status:     Movement: Present     General:  Alert, oriented and cooperative. Patient is in no acute distress.  Respiratory: Normal respiratory effort, no problems with respiration noted  Mental Status: Normal mood and affect. Normal behavior. Normal judgment and thought content.  Rest of physical exam deferred due to type of encounter  Imaging: No results found.  Assessment and Plan:  Pregnancy: G2P1001 at [redacted]w[redacted]d 1. Supervision of other normal pregnancy, antepartum   Preterm labor symptoms and general obstetric precautions including  but not limited to vaginal bleeding, contractions, leaking of fluid and fetal movement were reviewed in detail with the patient. I discussed the assessment and treatment plan with the patient. The patient was provided an opportunity to ask questions and all were answered. The patient agreed with the plan and demonstrated an understanding of the instructions. The patient was advised to call back or seek an in-person office evaluation/go to MAU at [facility] for any urgent or concerning symptoms. Please refer to After Visit Summary for other counseling recommendations.   I provided 15 minutes of face-to-face time during this encounter.  Return in about 2 weeks (around 11/21/2019) for MyChart.  Future Appointments  Date Time Provider Department Center  11/14/2019 10:50 AM Marny Lowenstein, PA-C [facility] None    Coral Ceo, MD Center for [facility], [facility] 11/07/19

## 2019-11-09 DIAGNOSIS — Z419 Encounter for procedure for purposes other than remedying health state, unspecified: Secondary | ICD-10-CM | POA: Diagnosis not present

## 2019-11-14 ENCOUNTER — Ambulatory Visit (INDEPENDENT_AMBULATORY_CARE_PROVIDER_SITE_OTHER): Payer: Medicaid Other | Admitting: Certified Nurse Midwife

## 2019-11-14 ENCOUNTER — Other Ambulatory Visit: Payer: Self-pay

## 2019-11-14 VITALS — BP 123/71 | HR 84 | Wt 138.6 lb

## 2019-11-14 DIAGNOSIS — Z3480 Encounter for supervision of other normal pregnancy, unspecified trimester: Secondary | ICD-10-CM

## 2019-11-14 MED ORDER — COMFORT FIT MATERNITY SUPP SM MISC
1.0000 [IU] | Freq: Every day | 0 refills | Status: DC | PRN
Start: 2019-11-14 — End: 2020-08-26

## 2019-11-14 NOTE — Progress Notes (Signed)
   PRENATAL VISIT NOTE  Subjective:  Jessica Ewing is a 39 y.o. G2P1001 at [redacted]w[redacted]d being seen today for ongoing prenatal care.  She is currently monitored for the following issues for this low-risk pregnancy and has Supervision of other normal pregnancy, antepartum and COVID-19 affecting pregnancy in second trimester on their problem list.  Patient reports Pain when bending down in her right side, wants a note to start maternity leave now because of the pain. Pt did not understand fully how FMLA and short-term disability work and speaks [place] with some [place]. Got intepreter online [phone]) to explain that if she goes out now, she will only get 2wks of partial pay and then 6wks after the baby arrives. She also thought pregnancy goes to 36wks, (65mo) not 40. Contractions: Irritability. Vag. Bleeding: None.  Movement: Present. Denies leaking of fluid.   The following portions of the patient's history were reviewed and updated as appropriate: allergies, current medications, past family history, past medical history, past social history, past surgical history and problem list.   Objective:   Vitals:   11/14/19 1103  BP: 123/71  Pulse: 84  Weight: 138 lb 9.6 oz (62.9 kg)    Fetal Status: Fetal Heart Rate (bpm): 136 Fundal Height: 28 cm Movement: Present     General:  Alert, oriented and cooperative. Patient is in no acute distress.  Skin: Skin is warm and dry. No rash noted.   Cardiovascular: Normal heart rate noted  Respiratory: Normal respiratory effort, no problems with respiration noted  Abdomen: Soft, gravid, appropriate for gestational age.  Pain/Pressure: Absent     Pelvic: Cervical exam deferred        Extremities: Normal range of motion.  Edema: None  Mental Status: Normal mood and affect. Normal behavior. Normal judgment and thought content.   Assessment and Plan:  Pregnancy: G2P1001 at [redacted]w[redacted]d 1. Supervision of other normal pregnancy, antepartum - FMLA paperwork given to admin,  pt verbalized understanding on how her leave will work - MFM OB US ordered for size less than dates and transverse positioning - Anticipatory guidance given on potential ECV if needed - Encouraged pt to do stretches that will help reposition baby to cephalic presentation - Maternity belt ordered  Preterm labor symptoms and general obstetric precautions including but not limited to vaginal bleeding, contractions, leaking of fluid and fetal movement were reviewed in detail with the patient. Please refer to After Visit Summary for other counseling recommendations.   Return in about 2 weeks (around 11/28/2019) for LOB w GBS.  Future Appointments  Date Time Provider Department Center  11/18/2019  1:45 PM [facility] NURSE [facility]  11/18/2019  2:00 PM [facility]  11/28/2019  8:15 AM Alric Seton, MD [facility] None    Bernerd Limbo, CNM

## 2019-11-14 NOTE — Progress Notes (Signed)
Pt is here for ROB, [redacted]w[redacted]d.

## 2019-11-14 NOTE — Patient Instructions (Signed)

## 2019-11-18 ENCOUNTER — Other Ambulatory Visit: Payer: Self-pay

## 2019-11-18 ENCOUNTER — Ambulatory Visit: Payer: Medicaid Other | Attending: Certified Nurse Midwife

## 2019-11-18 ENCOUNTER — Encounter: Payer: Self-pay | Admitting: *Deleted

## 2019-11-18 ENCOUNTER — Ambulatory Visit: Payer: Medicaid Other | Admitting: *Deleted

## 2019-11-18 DIAGNOSIS — Z3A34 34 weeks gestation of pregnancy: Secondary | ICD-10-CM | POA: Diagnosis not present

## 2019-11-18 DIAGNOSIS — O26843 Uterine size-date discrepancy, third trimester: Secondary | ICD-10-CM | POA: Diagnosis not present

## 2019-11-18 DIAGNOSIS — O09523 Supervision of elderly multigravida, third trimester: Secondary | ICD-10-CM | POA: Diagnosis not present

## 2019-11-18 DIAGNOSIS — Z3480 Encounter for supervision of other normal pregnancy, unspecified trimester: Secondary | ICD-10-CM

## 2019-11-21 DIAGNOSIS — Z3493 Encounter for supervision of normal pregnancy, unspecified, third trimester: Secondary | ICD-10-CM

## 2019-11-28 ENCOUNTER — Other Ambulatory Visit: Payer: Self-pay

## 2019-11-28 ENCOUNTER — Ambulatory Visit (INDEPENDENT_AMBULATORY_CARE_PROVIDER_SITE_OTHER): Payer: Medicaid Other | Admitting: Family Medicine

## 2019-11-28 ENCOUNTER — Other Ambulatory Visit (HOSPITAL_COMMUNITY)
Admission: RE | Admit: 2019-11-28 | Discharge: 2019-11-28 | Disposition: A | Discharge status: Home / Self Care | Payer: Medicaid Other | Source: Ambulatory Visit | Attending: Family Medicine | Admitting: Family Medicine

## 2019-11-28 VITALS — BP 113/73 | HR 76 | Wt 140.0 lb

## 2019-11-28 DIAGNOSIS — O98512 Other viral diseases complicating pregnancy, second trimester: Secondary | ICD-10-CM

## 2019-11-28 DIAGNOSIS — Z3480 Encounter for supervision of other normal pregnancy, unspecified trimester: Secondary | ICD-10-CM | POA: Insufficient documentation

## 2019-11-28 DIAGNOSIS — U071 COVID-19: Secondary | ICD-10-CM

## 2019-11-28 NOTE — Progress Notes (Addendum)
° °  PRENATAL VISIT NOTE  Subjective:  Jessica Ewing is a 39 y.o. G2P1001 at [redacted]w[redacted]d being seen today for ongoing prenatal care.  She is currently monitored for the following issues for this low-risk pregnancy and has Supervision of other normal pregnancy, antepartum and COVID-19 affecting pregnancy in second trimester on their problem list.  Patient reports no complaints. Contractions: Irritability. Vag. Bleeding: None.  Movement: Present. Denies leaking of fluid.   The following portions of the patient's history were reviewed and updated as appropriate: allergies, current medications, past family history, past medical history, past social history, past surgical history and problem list.   Objective:   Vitals:   11/28/19 0820  BP: 113/73  Pulse: 76  Weight: 140 lb (63.5 kg)    Fetal Status: Fetal Heart Rate (bpm): 134 Fundal Height: 33 cm Movement: Present     General:  Alert, oriented and cooperative. Patient is in no acute distress.  Skin: Skin is warm and dry. No rash noted.   Cardiovascular: Normal heart rate noted  Respiratory: Normal respiratory effort, no problems with respiration noted  Abdomen: Soft, gravid, appropriate for gestational age.  Pain/Pressure: Absent     Pelvic: Cervical exam deferred      External genitalia unremarkable without rashes or lesions.  Extremities: Normal range of motion.  Edema: None  Mental Status: Normal mood and affect. Normal behavior. Normal judgment and thought content.   Assessment and Plan:  Pregnancy: G2P1001 at [redacted]w[redacted]d 1. Supervision of other normal pregnancy, antepartum -Doing well without issues, taking PNV -Discussed that baby is now cephalic, measuring well on f/u u/s 11/18/19 (previously ordered for fundal height measuring less than dates), fundal height today 33cm, continue to monitor -GBS and gcc collected  Preterm labor symptoms and general obstetric precautions including but not limited to vaginal bleeding, contractions, leaking of  fluid and fetal movement were reviewed in detail with the patient. Please refer to After Visit Summary for other counseling recommendations.   Return in about 1 week (around 12/05/2019).  No future appointments.  Alric Seton, MD

## 2019-11-30 LAB — STREP GP B NAA: Strep Gp B NAA: NEGATIVE

## 2019-12-01 LAB — CERVICOVAGINAL ANCILLARY ONLY
Chlamydia: NEGATIVE
Comment: NEGATIVE
Comment: NORMAL
Neisseria Gonorrhea: NEGATIVE

## 2019-12-05 ENCOUNTER — Ambulatory Visit (INDEPENDENT_AMBULATORY_CARE_PROVIDER_SITE_OTHER): Payer: Medicaid Other | Admitting: Obstetrics

## 2019-12-05 ENCOUNTER — Other Ambulatory Visit: Payer: Self-pay

## 2019-12-05 ENCOUNTER — Encounter: Payer: Self-pay | Admitting: Obstetrics

## 2019-12-05 VITALS — BP 104/70 | HR 94 | Wt 139.4 lb

## 2019-12-05 DIAGNOSIS — R21 Rash and other nonspecific skin eruption: Secondary | ICD-10-CM | POA: Diagnosis not present

## 2019-12-05 DIAGNOSIS — U071 COVID-19: Secondary | ICD-10-CM

## 2019-12-05 DIAGNOSIS — O99891 Other specified diseases and conditions complicating pregnancy: Secondary | ICD-10-CM

## 2019-12-05 DIAGNOSIS — O0990 Supervision of high risk pregnancy, unspecified, unspecified trimester: Secondary | ICD-10-CM

## 2019-12-05 DIAGNOSIS — O09529 Supervision of elderly multigravida, unspecified trimester: Secondary | ICD-10-CM

## 2019-12-05 DIAGNOSIS — Z3A37 37 weeks gestation of pregnancy: Secondary | ICD-10-CM

## 2019-12-05 DIAGNOSIS — O98513 Other viral diseases complicating pregnancy, third trimester: Secondary | ICD-10-CM

## 2019-12-05 DIAGNOSIS — O09523 Supervision of elderly multigravida, third trimester: Secondary | ICD-10-CM

## 2019-12-05 MED ORDER — PREDNISONE 10 MG (21) PO TBPK: ORAL_TABLET | ORAL | 0 refills | Status: DC

## 2019-12-05 NOTE — Progress Notes (Signed)
Subjective:  Jessica Ewing is a 39 y.o. G2P1001 at [redacted]w[redacted]d being seen today for ongoing prenatal care.  She is currently monitored for the following issues for this high-risk pregnancy and has Supervision of other normal pregnancy, antepartum and COVID-19 affecting pregnancy in second trimester on their problem list.  Patient reports itching all over.  Contractions: Irritability. Vag. Bleeding: None.  Movement: Present. Denies leaking of fluid.   The following portions of the patient's history were reviewed and updated as appropriate: allergies, current medications, past family history, past medical history, past social history, past surgical history and problem list. Problem list updated.  Objective:   Vitals:   12/05/19 1017  BP: 104/70  Pulse: 94  Weight: 139 lb 6.4 oz (63.2 kg)    Fetal Status:     Movement: Present     General:  Alert, oriented and cooperative. Patient is in no acute distress.  Skin: Skin is warm and dry. No rash noted.   Cardiovascular: Normal heart rate noted  Respiratory: Normal respiratory effort, no problems with respiration noted  Abdomen: Soft, gravid, appropriate for gestational age. Pain/Pressure: Absent     Pelvic:  Cervical exam deferred        Extremities: Normal range of motion.  Edema: None  Mental Status: Normal mood and affect. Normal behavior. Normal judgment and thought content.   Urinalysis:      Assessment and Plan:  Pregnancy: G2P1001 at [redacted]w[redacted]d  1. Supervision of high risk pregnancy, antepartum  2. Antepartum multigravida of advanced maternal age  48. Rash and nonspecific skin eruption Rx: - predniSONE (STERAPRED UNI-PAK 21 TAB) 10 MG (21) TBPK tablet; Take as directed.  Dispense: 21 tablet; Refill: 0 - Bile acids, total - CBC - Comprehensive metabolic panel  4. COVID-19 affecting pregnancy in second trimester   Term labor symptoms and general obstetric precautions including but not limited to vaginal bleeding, contractions, leaking  of fluid and fetal movement were reviewed in detail with the patient. Please refer to After Visit Summary for other counseling recommendations.   Return in about 1 week (around 12/12/2019) for [facility].   Brock Bad, MD  12/05/19

## 2019-12-05 NOTE — Progress Notes (Signed)
Patient presents for ROB. She complains of having itching on her belly. Patient has no other concerns today.

## 2019-12-06 LAB — COMPREHENSIVE METABOLIC PANEL
ALT: 12 IU/L (ref 0–32)
AST: 13 IU/L (ref 0–40)
Albumin/Globulin Ratio: 1.4 (ref 1.2–2.2)
Albumin: 3.7 g/dL — ABNORMAL LOW (ref 3.8–4.8)
Alkaline Phosphatase: 95 IU/L (ref 48–121)
BUN/Creatinine Ratio: 15 (ref 9–23)
BUN: 9 mg/dL (ref 6–20)
Bilirubin Total: 0.3 mg/dL (ref 0.0–1.2)
CO2: 20 mmol/L (ref 20–29)
Calcium: 8.9 mg/dL (ref 8.7–10.2)
Chloride: 101 mmol/L (ref 96–106)
Creatinine, Ser: 0.61 mg/dL (ref 0.57–1.00)
GFR calc Af Amer: 133 mL/min/{1.73_m2} (ref >59)
GFR calc non Af Amer: 115 mL/min/{1.73_m2} (ref >59)
Globulin, Total: 2.7 g/dL (ref 1.5–4.5)
Glucose: 82 mg/dL (ref 65–99)
Potassium: 4.4 mmol/L (ref 3.5–5.2)
Sodium: 135 mmol/L (ref 134–144)
Total Protein: 6.4 g/dL (ref 6.0–8.5)

## 2019-12-06 LAB — BILE ACIDS, TOTAL: Bile Acids Total: 2.6 umol/L (ref 0.0–10.0)

## 2019-12-06 LAB — CBC
Hematocrit: 35.2 % (ref 34.0–46.6)
Hemoglobin: 11.1 g/dL (ref 11.1–15.9)
MCH: 26 pg — ABNORMAL LOW (ref 26.6–33.0)
MCHC: 31.5 g/dL (ref 31.5–35.7)
MCV: 82 fL (ref 79–97)
Platelets: 398 10*3/uL (ref 150–450)
RBC: 4.27 x10E6/uL (ref 3.77–5.28)
RDW: 13.9 % (ref 11.7–15.4)
WBC: 10.5 10*3/uL (ref 3.4–10.8)

## 2019-12-10 DIAGNOSIS — Z419 Encounter for procedure for purposes other than remedying health state, unspecified: Secondary | ICD-10-CM | POA: Diagnosis not present

## 2019-12-12 ENCOUNTER — Other Ambulatory Visit: Payer: Self-pay

## 2019-12-12 ENCOUNTER — Ambulatory Visit (INDEPENDENT_AMBULATORY_CARE_PROVIDER_SITE_OTHER): Payer: Medicaid Other | Admitting: Obstetrics and Gynecology

## 2019-12-12 VITALS — BP 127/77 | HR 86 | Wt 141.0 lb

## 2019-12-12 DIAGNOSIS — O09529 Supervision of elderly multigravida, unspecified trimester: Secondary | ICD-10-CM

## 2019-12-12 DIAGNOSIS — Z3480 Encounter for supervision of other normal pregnancy, unspecified trimester: Secondary | ICD-10-CM

## 2019-12-12 DIAGNOSIS — R21 Rash and other nonspecific skin eruption: Secondary | ICD-10-CM

## 2019-12-12 MED ORDER — HYDROCORTISONE 1 % EX OINT: 1.0000 "application " | TOPICAL_OINTMENT | Freq: Two times a day (BID) | CUTANEOUS | 0 refills | Status: DC

## 2019-12-12 NOTE — Patient Instructions (Signed)
Continue taking prednisone for your itching. You can use the lotion on the belly, as well as trying benadryl for the itching.

## 2019-12-12 NOTE — Progress Notes (Signed)
   PRENATAL VISIT NOTE  Subjective:  Jessica Ewing is a 39 y.o. G2P1001 at [redacted]w[redacted]d being seen today for ongoing prenatal care.  She is currently monitored for the following issues for this low-risk pregnancy and has Supervision of other normal pregnancy, antepartum and COVID-19 affecting pregnancy in second trimester on their problem list.  Patient reports no complaints.  Contractions: Irritability. Vag. Bleeding: None.  Movement: Present. Denies leaking of fluid. Feeling lots of baby movement. Some contractions that are irregular and go away.   Has rash on belly, using topical coconut oil. Was sent script for prednisone tabs and started taking on Friday. Says they are helping a little.   Having a baby girl.   The following portions of the patient's history were reviewed and updated as appropriate: allergies, current medications, past family history, past medical history, past social history, past surgical history and problem list.   Objective:   Vitals:   12/12/19 1015  BP: 127/77  Pulse: 86  Weight: 141 lb (64 kg)    Fetal Status: Fetal Heart Rate (bpm): 132   Movement: Present     General:  Alert, oriented and cooperative. Patient is in no acute distress.  Skin: Skin is warm and dry. No rash noted.   Cardiovascular: Normal heart rate noted  Respiratory: Normal respiratory effort, no problems with respiration noted  Abdomen: Soft, gravid, appropriate for gestational age.  Pain/Pressure: Present     Pelvic: Cervical exam deferred        Extremities: Normal range of motion.  Edema: None  Mental Status: Normal mood and affect. Normal behavior. Normal judgment and thought content.   Assessment and Plan:  Pregnancy: G2P1001 at [redacted]w[redacted]d 1. Supervision of other normal pregnancy, antepartum -doing well overall, fundal height continues to measure small (36 today), had normal growth 8/10  -f/u in one week, schedule IOL at that time for PD   2. Rash and nonspecific skin eruption -continue  PO prednisone, reviewed labs from last week and are normal  -can use topical hydrocortisone cream, benadryl prn   3. Antepartum multigravida of advanced maternal age   Term labor symptoms and general obstetric precautions including but not limited to vaginal bleeding, contractions, leaking of fluid and fetal movement were reviewed in detail with the patient. Please refer to After Visit Summary for other counseling recommendations.   Return in about 1 week (around 12/19/2019) for OB, any provider.  Future Appointments  Date Time Provider Department Center  12/19/2019 10:30 AM Leftwich-Kirby, Wilmer Floor, CNM [facility] None    Gita Kudo, MD

## 2019-12-15 ENCOUNTER — Observation Stay (HOSPITAL_COMMUNITY): Payer: Medicaid Other | Admitting: Anesthesiology

## 2019-12-15 ENCOUNTER — Encounter (HOSPITAL_COMMUNITY): Payer: Self-pay | Admitting: Obstetrics and Gynecology

## 2019-12-15 ENCOUNTER — Inpatient Hospital Stay (HOSPITAL_COMMUNITY)
Admission: AD | Admit: 2019-12-15 | Discharge: 2019-12-17 | DRG: 807 | Disposition: A | Discharge status: Home / Self Care | Payer: Medicaid Other | Attending: Obstetrics and Gynecology | Admitting: Obstetrics and Gynecology

## 2019-12-15 ENCOUNTER — Other Ambulatory Visit: Payer: Self-pay

## 2019-12-15 DIAGNOSIS — Z20822 Contact with and (suspected) exposure to covid-19: Secondary | ICD-10-CM | POA: Diagnosis not present

## 2019-12-15 DIAGNOSIS — Z3A38 38 weeks gestation of pregnancy: Secondary | ICD-10-CM

## 2019-12-15 DIAGNOSIS — O4202 Full-term premature rupture of membranes, onset of labor within 24 hours of rupture: Secondary | ICD-10-CM

## 2019-12-15 DIAGNOSIS — Z3480 Encounter for supervision of other normal pregnancy, unspecified trimester: Secondary | ICD-10-CM

## 2019-12-15 DIAGNOSIS — O4292 Full-term premature rupture of membranes, unspecified as to length of time between rupture and onset of labor: Principal | ICD-10-CM | POA: Diagnosis present

## 2019-12-15 DIAGNOSIS — O26893 Other specified pregnancy related conditions, third trimester: Secondary | ICD-10-CM | POA: Diagnosis not present

## 2019-12-15 DIAGNOSIS — Z8616 Personal history of COVID-19: Secondary | ICD-10-CM | POA: Diagnosis not present

## 2019-12-15 HISTORY — DX: Other specified health status: Z78.9

## 2019-12-15 LAB — CBC
HCT: 34.4 % — ABNORMAL LOW (ref 36.0–46.0)
Hemoglobin: 10.8 g/dL — ABNORMAL LOW (ref 12.0–15.0)
MCH: 24.9 pg — ABNORMAL LOW (ref 26.0–34.0)
MCHC: 31.4 g/dL (ref 30.0–36.0)
MCV: 79.4 fL — ABNORMAL LOW (ref 80.0–100.0)
Platelets: 405 10*3/uL — ABNORMAL HIGH (ref 150–400)
RBC: 4.33 MIL/uL (ref 3.87–5.11)
RDW: 14.7 % (ref 11.5–15.5)
WBC: 12.1 10*3/uL — ABNORMAL HIGH (ref 4.0–10.5)
nRBC: 0 % (ref 0.0–0.2)

## 2019-12-15 LAB — RPR: RPR Ser Ql: NONREACTIVE

## 2019-12-15 LAB — TYPE AND SCREEN
ABO/RH(D): O POS
Antibody Screen: NEGATIVE

## 2019-12-15 LAB — POCT FERN TEST: POCT Fern Test: POSITIVE

## 2019-12-15 LAB — SARS CORONAVIRUS 2 BY RT PCR (HOSPITAL ORDER, PERFORMED IN ~~LOC~~ HOSPITAL LAB): SARS Coronavirus 2: NEGATIVE

## 2019-12-15 MED ORDER — OXYCODONE-ACETAMINOPHEN 5-325 MG PO TABS: 1.0000 | ORAL_TABLET | ORAL | Status: DC | PRN

## 2019-12-15 MED ORDER — PHENYLEPHRINE 40 MCG/ML (10ML) SYRINGE FOR IV PUSH (FOR BLOOD PRESSURE SUPPORT)
80.0000 ug | PREFILLED_SYRINGE | INTRAVENOUS | Status: DC | PRN
  Administered 2019-12-15: 80 ug via INTRAVENOUS

## 2019-12-15 MED ORDER — COCONUT OIL OIL: 1.0000 "application " | TOPICAL_OIL | Status: DC | PRN

## 2019-12-15 MED ORDER — ONDANSETRON HCL 4 MG/2ML IJ SOLN: 4.0000 mg | INTRAMUSCULAR | Status: DC | PRN

## 2019-12-15 MED ORDER — WITCH HAZEL-GLYCERIN EX PADS: 1.0000 "application " | MEDICATED_PAD | CUTANEOUS | Status: DC | PRN

## 2019-12-15 MED ORDER — OXYCODONE-ACETAMINOPHEN 5-325 MG PO TABS: 2.0000 | ORAL_TABLET | ORAL | Status: DC | PRN

## 2019-12-15 MED ORDER — TETANUS-DIPHTH-ACELL PERTUSSIS 5-2.5-18.5 LF-MCG/0.5 IM SUSP: 0.5000 mL | Freq: Once | INTRAMUSCULAR | Status: DC

## 2019-12-15 MED ORDER — DIBUCAINE (PERIANAL) 1 % EX OINT: 1.0000 "application " | TOPICAL_OINTMENT | CUTANEOUS | Status: DC | PRN

## 2019-12-15 MED ORDER — PHENYLEPHRINE 40 MCG/ML (10ML) SYRINGE FOR IV PUSH (FOR BLOOD PRESSURE SUPPORT)
80.0000 ug | PREFILLED_SYRINGE | INTRAVENOUS | Status: DC | PRN
  Filled 2019-12-15: qty 10

## 2019-12-15 MED ORDER — LIDOCAINE HCL (PF) 1 % IJ SOLN
INTRAMUSCULAR | Status: DC | PRN
  Administered 2019-12-15: 2 mL via EPIDURAL
  Administered 2019-12-15: 10 mL via EPIDURAL

## 2019-12-15 MED ORDER — ACETAMINOPHEN 325 MG PO TABS: 650.0000 mg | ORAL_TABLET | ORAL | Status: DC | PRN

## 2019-12-15 MED ORDER — BENZOCAINE-MENTHOL 20-0.5 % EX AERO
1.0000 "application " | INHALATION_SPRAY | CUTANEOUS | Status: DC | PRN
  Administered 2019-12-15: 1 via TOPICAL
  Filled 2019-12-15: qty 56

## 2019-12-15 MED ORDER — EPHEDRINE 5 MG/ML INJ: 10.0000 mg | INTRAVENOUS | Status: DC | PRN

## 2019-12-15 MED ORDER — SENNOSIDES-DOCUSATE SODIUM 8.6-50 MG PO TABS
2.0000 | ORAL_TABLET | ORAL | Status: DC
  Administered 2019-12-15 – 2019-12-16 (×2): 2 via ORAL
  Filled 2019-12-15 (×2): qty 2

## 2019-12-15 MED ORDER — IBUPROFEN 600 MG PO TABS
600.0000 mg | ORAL_TABLET | Freq: Four times a day (QID) | ORAL | Status: DC
  Administered 2019-12-15 – 2019-12-16 (×4): 600 mg via ORAL
  Filled 2019-12-15 (×4): qty 1

## 2019-12-15 MED ORDER — ONDANSETRON HCL 4 MG/2ML IJ SOLN: 4.0000 mg | Freq: Four times a day (QID) | INTRAMUSCULAR | Status: DC | PRN

## 2019-12-15 MED ORDER — DIPHENHYDRAMINE HCL 50 MG/ML IJ SOLN: 12.5000 mg | INTRAMUSCULAR | Status: DC | PRN

## 2019-12-15 MED ORDER — LACTATED RINGERS IV SOLN: 500.0000 mL | INTRAVENOUS | Status: DC | PRN

## 2019-12-15 MED ORDER — LIDOCAINE HCL (PF) 1 % IJ SOLN: 30.0000 mL | INTRAMUSCULAR | Status: DC | PRN

## 2019-12-15 MED ORDER — BUPIVACAINE HCL (PF) 0.75 % IJ SOLN
INTRAMUSCULAR | Status: DC | PRN
Start: 2019-12-15 — End: 2019-12-15
  Administered 2019-12-15: 12 mL/h via EPIDURAL

## 2019-12-15 MED ORDER — LACTATED RINGERS IV SOLN: INTRAVENOUS | Status: DC

## 2019-12-15 MED ORDER — DIPHENHYDRAMINE HCL 25 MG PO CAPS: 25.0000 mg | ORAL_CAPSULE | Freq: Four times a day (QID) | ORAL | Status: DC | PRN

## 2019-12-15 MED ORDER — SIMETHICONE 80 MG PO CHEW: 80.0000 mg | CHEWABLE_TABLET | ORAL | Status: DC | PRN

## 2019-12-15 MED ORDER — ZOLPIDEM TARTRATE 5 MG PO TABS: 5.0000 mg | ORAL_TABLET | Freq: Every evening | ORAL | Status: DC | PRN

## 2019-12-15 MED ORDER — OXYTOCIN-SODIUM CHLORIDE 30-0.9 UT/500ML-% IV SOLN
2.5000 [IU]/h | INTRAVENOUS | Status: DC
  Administered 2019-12-15: 2.5 [IU]/h via INTRAVENOUS
  Filled 2019-12-15: qty 500

## 2019-12-15 MED ORDER — LACTATED RINGERS IV SOLN: 500.0000 mL | Freq: Once | INTRAVENOUS | Status: DC

## 2019-12-15 MED ORDER — SOD CITRATE-CITRIC ACID 500-334 MG/5ML PO SOLN: 30.0000 mL | ORAL | Status: DC | PRN

## 2019-12-15 MED ORDER — PRENATAL MULTIVITAMIN CH
1.0000 | ORAL_TABLET | Freq: Every day | ORAL | Status: DC
  Administered 2019-12-15 – 2019-12-16 (×2): 1 via ORAL
  Filled 2019-12-15 (×2): qty 1

## 2019-12-15 MED ORDER — FENTANYL-BUPIVACAINE-NACL 0.5-0.125-0.9 MG/250ML-% EP SOLN
12.0000 mL/h | EPIDURAL | Status: DC | PRN
  Filled 2019-12-15: qty 250

## 2019-12-15 MED ORDER — ONDANSETRON HCL 4 MG PO TABS: 4.0000 mg | ORAL_TABLET | ORAL | Status: DC | PRN

## 2019-12-15 MED ORDER — MEASLES, MUMPS & RUBELLA VAC IJ SOLR: 0.5000 mL | Freq: Once | INTRAMUSCULAR | Status: DC

## 2019-12-15 MED ORDER — OXYTOCIN BOLUS FROM INFUSION
333.0000 mL | Freq: Once | INTRAVENOUS | Status: AC
  Administered 2019-12-15: 333 mL via INTRAVENOUS

## 2019-12-15 MED ORDER — OXYCODONE HCL 5 MG PO TABS
5.0000 mg | ORAL_TABLET | ORAL | Status: DC | PRN
  Administered 2019-12-16 – 2019-12-17 (×5): 5 mg via ORAL
  Filled 2019-12-15 (×6): qty 1

## 2019-12-15 NOTE — Anesthesia Procedure Notes (Signed)
Epidural Patient location during procedure: OB Start time: 12/15/2019 5:34 AM End time: 12/15/2019 5:44 AM  Staffing Anesthesiologist: Lannie Fields, DO Performed: anesthesiologist   Preanesthetic Checklist Completed: patient identified, IV checked, risks and benefits discussed, monitors and equipment checked, pre-op evaluation and timeout performed  Epidural Patient position: sitting Prep: DuraPrep and site prepped and draped Patient monitoring: continuous pulse ox, blood pressure, heart rate and cardiac monitor Approach: midline Location: L3-L4 Injection technique: LOR air  Needle:  Needle type: Tuohy  Needle gauge: 17 G Needle length: 9 cm Needle insertion depth: 5 cm Catheter type: closed end flexible Catheter size: 19 Gauge Catheter at skin depth: 10 cm Test dose: negative  Assessment Sensory level: T8 Events: blood not aspirated, injection not painful, no injection resistance, no paresthesia and negative IV test  Additional Notes Patient identified. Risks/Benefits/Options discussed with patient including but not limited to bleeding, infection, nerve damage, paralysis, failed block, incomplete pain control, headache, blood pressure changes, nausea, vomiting, reactions to medication both or allergic, itching and postpartum back pain. Confirmed with bedside nurse the patient's most recent platelet count. Confirmed with patient that they are not currently taking any anticoagulation, have any bleeding history or any family history of bleeding disorders. Patient expressed understanding and wished to proceed. All questions were answered. Sterile technique was used throughout the entire procedure. Please see nursing notes for vital signs. Test dose was given through epidural catheter and negative prior to continuing to dose epidural or start infusion. Warning signs of high block given to the patient including shortness of breath, tingling/numbness in hands, complete motor block,  or any concerning symptoms with instructions to call for help. Patient was given instructions on fall risk and not to get out of bed. All questions and concerns addressed with instructions to call with any issues or inadequate analgesia.  Reason for block:procedure for pain

## 2019-12-15 NOTE — Anesthesia Preprocedure Evaluation (Signed)
Anesthesia Evaluation  Patient identified by MRN, date of birth, ID band Patient awake    Reviewed: Allergy & Precautions, Patient's Chart, lab work & pertinent test results  Airway Mallampati: II  TM Distance: >3 FB Neck ROM: Full    Dental no notable dental hx.    Pulmonary neg pulmonary ROS,    Pulmonary exam normal breath sounds clear to auscultation       Cardiovascular negative cardio ROS Normal cardiovascular exam Rhythm:Regular Rate:Normal     Neuro/Psych negative neurological ROS  negative psych ROS   GI/Hepatic negative GI ROS, Neg liver ROS,   Endo/Other  negative endocrine ROS  Renal/GU negative Renal ROS  negative genitourinary   Musculoskeletal negative musculoskeletal ROS (+)   Abdominal   Peds  Hematology negative hematology ROS (+) hct 34.4, plt 405   Anesthesia Other Findings   Reproductive/Obstetrics (+) Pregnancy PROM 38 4/7                             Anesthesia Physical Anesthesia Plan  ASA: II and emergent  Anesthesia Plan: Epidural   Post-op Pain Management:    Induction:   PONV Risk Score and Plan: 2  Airway Management Planned: Natural Airway  Additional Equipment: None  Intra-op Plan:   Post-operative Plan:   Informed Consent: I have reviewed the patients History and Physical, chart, labs and discussed the procedure including the risks, benefits and alternatives for the proposed anesthesia with the patient or authorized representative who has indicated his/her understanding and acceptance.       Plan Discussed with:   Anesthesia Plan Comments:         Anesthesia Quick Evaluation

## 2019-12-15 NOTE — Discharge Instructions (Signed)

## 2019-12-15 NOTE — Discharge Summary (Signed)
Postpartum Discharge Summary   Patient Name: Jessica Ewing DOB: 1980-05-16 MRN: [id number]  Date of admission: 12/15/2019 Delivery date:12/15/2019  Delivering provider: Serita Grammes D  Date of discharge: 12/17/2019  Admitting diagnosis: PROM (premature rupture of membranes) [O42.90] Spontaneous vaginal delivery [O80] Intrauterine pregnancy: [redacted]w[redacted]d    Secondary diagnosis:  Active Problems:   Labor and delivery, indication for care   Spontaneous vaginal delivery  Additional problems: none    Discharge diagnosis: Term Pregnancy Delivered                                              Post partum procedures:none Augmentation: none Complications: None  Hospital course: Onset of Labor With Vaginal Delivery      39y.o. yo G2P1001 at 347w4das admitted in Latent Labor with SROM on 12/15/2019. Patient had an uncomplicated labor course including having an epidural placed and progressing spontaneously without augmentation to vag del as follows:  Membrane Rupture Time/Date: 12:00 AM ,12/15/2019   Delivery Method:Vaginal, Spontaneous  Episiotomy: None  Lacerations:  1st degree;Perineal  Patient's postpartum course complicated by pelvic pain. PT was consulted.  She is tolerating a regular diet, passing flatus, and urinating well. Patient is discharged home in stable condition on 12/17/19.  Newborn Data: Birth date:12/15/2019  Birth time:9:30 AM  Gender:Female  Living status:Living  Apgars:9 ,10  Weight:2906 g  2906gm (6lb 6.5oz)  Magnesium Sulfate received: No BMZ received: No Rhophylac:N/A MMR:N/A T-DaP:Given prenatally Flu: N/A Transfusion:No  Physical exam  Vitals:   12/16/19 0528 12/16/19 1446 12/16/19 2120 12/17/19 0533  BP: 103/71 106/74 106/73 108/72  Pulse: 70 70 73 79  Resp: '17 18 18 16  ' Temp: 98 F (36.7 C) 97.7 F (36.5 C) 98.2 F (36.8 C) 98.2 F (36.8 C)  TempSrc: Oral Oral Oral Oral  SpO2: 100% 99% 99% 100%  Height:       General: alert, cooperative and no  distress Lochia: appropriate Uterine Fundus: firm Incision: N/A DVT Evaluation: No evidence of DVT seen on physical exam. Labs: Lab Results  Component Value Date   WBC 12.1 (H) 12/15/2019   HGB 10.8 (L) 12/15/2019   HCT 34.4 (L) 12/15/2019   MCV 79.4 (L) 12/15/2019   PLT 405 (H) 12/15/2019   CMP Latest Ref Rng & Units 12/05/2019  Glucose 65 - 99 mg/dL 82  BUN 6 - 20 mg/dL 9  Creatinine 0.57 - 1.00 mg/dL 0.61  Sodium 134 - 144 mmol/L 135  Potassium 3.5 - 5.2 mmol/L 4.4  Chloride 96 - 106 mmol/L 101  CO2 20 - 29 mmol/L 20  Calcium 8.7 - 10.2 mg/dL 8.9  Total Protein 6.0 - 8.5 g/dL 6.4  Total Bilirubin 0.0 - 1.2 mg/dL 0.3  Alkaline Phos 48 - 121 IU/L 95  AST 0 - 40 IU/L 13  ALT 0 - 32 IU/L 12   Edinburgh Score: Edinburgh Postnatal Depression Scale Screening Tool 12/15/2019  I have been able to laugh and see the funny side of things. 0  I have looked forward with enjoyment to things. 0  I have blamed myself unnecessarily when things went wrong. 1  I have been anxious or worried for no good reason. 2  I have felt scared or panicky for no good reason. 2  Things have been getting on top of me. 2  I have been so unhappy that  I have had difficulty sleeping. 1  I have felt sad or miserable. 1  I have been so unhappy that I have been crying. 1  The thought of harming myself has occurred to me. 0  Edinburgh Postnatal Depression Scale Total 10     After visit meds:  Allergies as of 12/17/2019   No Known Allergies     Medication List    STOP taking these medications   Blood Pressure Kit Devi   predniSONE 10 MG (21) Tbpk tablet Commonly known as: STERAPRED UNI-PAK 21 TAB     TAKE these medications   acetaminophen 325 MG tablet Commonly known as: Tylenol Take 2 tablets (650 mg total) by mouth every 6 (six) hours.   Comfort Fit Maternity Supp Sm Misc 1 Units by Does not apply route daily as needed.   hydrocortisone 1 % ointment Apply 1 application topically 2 (two) times  daily.   ibuprofen 600 MG tablet Commonly known as: ADVIL Take 1 tablet (600 mg total) by mouth every 8 (eight) hours.   senna-docusate 8.6-50 MG tablet Commonly known as: Senokot-S Take 2 tablets by mouth daily. Start taking on: December 18, 2019   Vitafol Gummies 3.33-0.333-34.8 MG Chew Chew 3 Doses by mouth daily.        Discharge home in stable condition Infant Feeding: Bottle and Breast Infant Disposition:home with mother Discharge instruction: per After Visit Summary and Postpartum booklet. Activity: Advance as tolerated. Pelvic rest for 6 weeks.  Diet: routine diet Future Appointments: Future Appointments  Date Time Provider [place]  01/20/2020  1:00 PM Cephas Darby, MD [place] None   Follow up Visit:  [place] Follow up in 6 week(s).   Specialty: Obstetrics and Gynecology Contact information: [address] [place] [phone]              Myrtis Ser, CNM  P [facility] Please schedule this patient for Postpartum visit in: 4 weeks with the following provider: Any provider  In-Person  For C/S patients schedule nurse incision check in weeks 2 weeks: no  Low risk pregnancy complicated by: none  Delivery mode: SVD  Anticipated Birth Control: Nexplanon  PP Procedures needed: none  Schedule Integrated [place] visit: no   12/17/2019 Janet Berlin, MD

## 2019-12-15 NOTE — MAU Note (Signed)
.   Jessica Ewing is a 39 y.o. at [redacted]w[redacted]d here in MAU reporting: SROM at 0100. Clear fluid, bloody show, endorses good fetal movement. GBS neg.   Pain score: 5 Vitals:   12/15/19 0140  BP: (!) 142/70  Pulse: 81  Resp: 16  Temp: 97.8 F (36.6 C)  SpO2: 100%     FHT:134

## 2019-12-15 NOTE — MAU Provider Note (Signed)
Pt informed that the ultrasound is considered a limited OB ultrasound and is not intended to be a complete ultrasound exam.  Patient also informed that the ultrasound is not being completed with the intent of assessing for fetal or placental anomalies or any pelvic abnormalities.  Explained that the purpose of today's ultrasound is to assess for  presentation.  Patient acknowledges the purpose of the exam and the limitations of the study.    Irving Lubbers, Odie Sera, NP  2:01 AM 12/15/2019

## 2019-12-15 NOTE — Anesthesia Postprocedure Evaluation (Signed)
Anesthesia Post Note  Patient: Jessica Ewing  Procedure(s) Performed: AN AD HOC LABOR EPIDURAL     Patient location during evaluation: Mother Baby Anesthesia Type: Epidural Level of consciousness: awake and alert and oriented Pain management: satisfactory to patient Vital Signs Assessment: post-procedure vital signs reviewed and stable Respiratory status: respiratory function stable Cardiovascular status: stable Postop Assessment: no headache, no backache, epidural receding, patient able to bend at knees, no signs of nausea or vomiting and adequate PO intake Anesthetic complications: no   No complications documented.  Last Vitals:  Vitals:   12/15/19 1050 12/15/19 1145  BP: 120/60 102/67  Pulse: 86 84  Resp: 16 16  Temp: 37.2 C 37 C  SpO2: 98% 98%    Last Pain:  Vitals:   12/15/19 1145  TempSrc: Oral  PainSc: 0-No pain   Pain Goal:                   Lasasha Brophy

## 2019-12-15 NOTE — H&P (Signed)
Jessica Ewing is a 39 y.o. female G2P1001 presenting for SROM/SOL.  Pregnancy has been complicated only by COVID infection 08/2019.     Nursing Staff Provider  Office Location  [facility] Dating  LMP c/w 19wk sono  Language  ENGLISH/[place]  Anatomy US  Normal  Flu Vaccine   Genetic Screen  Materna21: negative   TDaP vaccine  10/02/2019 GTT Third trimester Nml 2 hr GTT  Rhogam   NA   LAB RESULTS   Feeding Plan  bottle Blood Type O/Positive/-- (06/03 1616) O positive  Contraception  unsure/Nexplanon (11/14/19) Antibody Negative (06/03 1616)negative  Circumcision  N/A - girl Rubella 10.80 (06/03 1616)immune  Pediatrician   Dr. Leona Singleton RPR Non Reactive (06/03 1616) negative  Support Person  Husband HBsAg Negative (06/03 1616) negative  Prenatal Classes No  HIV Non Reactive (06/03 1616)negative  BTL Consent N/A GBS  (For PCN allergy, check sensitivities)   VBAC Consent N/A Pap Negative 06/2019    Hgb Electro  AA  BP Cuff Ordered 05/12/2019 CF     SMA     Waterbirth  [ ]  Class [ ]  Consent [ ]  CNM visit   OB History    Gravida  2   Para  1   Term  1   Preterm      AB      Living  1     SAB      TAB      Ectopic      Multiple      Live Births  1          Past Medical History:  Diagnosis Date  . Medical history non-contributory    Past Surgical History:  Procedure Laterality Date  . NO PAST SURGERIES     Family History: family history is not on file. Social History:  reports that she has never smoked. She has never used smokeless tobacco. She reports that she does not drink alcohol and does not use drugs.     Maternal Diabetes: No Genetic Screening: Normal Maternal Ultrasounds/Referrals: Normal Fetal Ultrasounds or other Referrals:  None Maternal Substance Abuse:  No Significant Maternal Medications:  None Significant Maternal Lab Results:  Group B Strep negative Other Comments:  None  Review of Systems  Constitutional: Negative for chills, fatigue and  fever.  Eyes: Negative for visual disturbance.  Respiratory: Negative for shortness of breath.   Cardiovascular: Negative for chest pain.  Gastrointestinal: Positive for abdominal pain. Negative for nausea and vomiting.  Genitourinary: Negative for difficulty urinating, dysuria, flank pain, pelvic pain, vaginal bleeding, vaginal discharge and vaginal pain.  Musculoskeletal: Positive for back pain.  Neurological: Negative for dizziness and headaches.  Psychiatric/Behavioral: Negative.    Maternal Medical History:  Reason for admission: Rupture of membranes and contractions.  Nausea.  Contractions: Onset was 3-5 hours ago.    Fetal activity: Perceived fetal activity is normal.    Prenatal complications: no prenatal complications Prenatal Complications - Diabetes: none.    Dilation: 4 Effacement (%): 50 Station: -3 Exam by:: Leftwich Kirby CNM Blood pressure 117/77, pulse 90, temperature 98.3 F (36.8 C), temperature source Oral, resp. rate 16, height 5\' 3"  (1.6 m), last menstrual period 03/20/2019, SpO2 100 %. Maternal Exam:  Uterine Assessment: Contraction strength is mild.  Contraction frequency is regular.   Abdomen: Fetal presentation: vertex  Pelvis: adequate for delivery.   Cervix: Cervix evaluated by digital exam.     Fetal Exam Fetal Monitor Review: Mode: ultrasound.   Baseline  rate: 135.  Variability: moderate (6-25 bpm).   Pattern: accelerations present and no decelerations.    Fetal State Assessment: Category I - tracings are normal.     Physical Exam Vitals and nursing note reviewed.  Constitutional:      Appearance: She is well-developed.  Cardiovascular:     Rate and Rhythm: Normal rate and regular rhythm.  Pulmonary:     Effort: Pulmonary effort is normal.     Breath sounds: Normal breath sounds.  Abdominal:     Palpations: Abdomen is soft.  Musculoskeletal:        General: Normal range of motion.     Cervical back: Normal range of motion.   Skin:    General: Skin is warm and dry.  Neurological:     Mental Status: She is alert and oriented to person, place, and time.  Psychiatric:        Behavior: Behavior normal.        Thought Content: Thought content normal.        Judgment: Judgment normal.     Prenatal labs: ABO, Rh: --/--/O POS (09/06 0210) Antibody: NEG (09/06 0210) Rubella: 10.80 (06/03 1616) RPR: Non Reactive (06/24 0945)  HBsAg: Negative (06/03 1616)  HIV: Non Reactive (06/24 0945)  GBS: Negative/-- (08/20 0920)   Assessment/Plan: 39 y.o. G2P1001 at [redacted]w[redacted]d PROM/early labor GBS negative  Admit to L&D Expectant management on admission Pt may have epidural as desired Anticipate NSVD   Sharen Counter 12/15/2019, 5:10 AM

## 2019-12-16 MED ORDER — IBUPROFEN 600 MG PO TABS
600.0000 mg | ORAL_TABLET | Freq: Three times a day (TID) | ORAL | Status: DC
  Administered 2019-12-16 – 2019-12-17 (×3): 600 mg via ORAL
  Filled 2019-12-16 (×3): qty 1

## 2019-12-16 MED ORDER — ACETAMINOPHEN 325 MG PO TABS
650.0000 mg | ORAL_TABLET | Freq: Four times a day (QID) | ORAL | Status: DC
  Administered 2019-12-16 – 2019-12-17 (×5): 650 mg via ORAL
  Filled 2019-12-16 (×5): qty 2

## 2019-12-16 NOTE — Progress Notes (Signed)
CSW received consult due to score 10 on Edinburgh Depression Screen.    CSW congratulated MOB and FOB on the birth of infant. CSW advised MOB of CSW's role and the reason for CSW coming to speak with her. MOB reported that she had COVID about 4 months ago. MOB reported having fear of having infant and felt "worrying". MOB reported no hx of any mental health hx and denies feelings of SI and HI. MON expressed that she has support from her spouse and reported that feels good mentally but is having pain physically. MOB reported that she has all needed items to care for infant with no other needs identified at this time. MOB reported that infant would be [facility] at [facility] for further care. MOB also expressed that infant would sleep in basinet once arrived home.   CSW provided education regarding Baby Blues vs PMADs and provided MOB with resources for mental health follow up.  CSW encouraged MOB to evaluate her mental health throughout the postpartum period with the use of the New Mom Checklist developed by Postpartum Progress as well as the [place] Postnatal Depression Scale and notify a medical professional if symptoms arise.     Claude Manges Reveca Desmarais, MSW, LCSW [facility] [place] [phone]

## 2019-12-16 NOTE — Progress Notes (Signed)
Post Partum Day 1 Subjective: voiding, tolerating PO and + flatus. Patient has pain that is limiting her movement. She has not had a bowel movement yet but is passing gas.  Lochia is improving.   Objective: Blood pressure 103/71, pulse 70, temperature 98 F (36.7 C), temperature source Oral, resp. rate 17, height 5\' 3"  (1.6 m), last menstrual period 03/20/2019, SpO2 100 %, unknown if currently breastfeeding.  Physical Exam:  General: alert, cooperative and no distress Lochia: appropriate Uterine Fundus: firm Incision: N/A DVT Evaluation: No evidence of DVT seen on physical exam. No significant calf/ankle edema.  Recent Labs    12/15/19 0232  HGB 10.8*  HCT 34.4*    Assessment/Plan: Plan for discharge tomorrow  Scheduled Tylenol and Ibuprofen for better pain control.    LOS: 1 day   02/14/20 12/16/2019, 9:32 AM

## 2019-12-16 NOTE — Progress Notes (Signed)
Patient has complained of generalized soreness in her legs, arms especially muscles and perineal area. She states she is sore when she walks to bathroom and did not feel this way yesterday. She rates her pain up to 9 while being very talkative and stretching legs and arms. She has received scheduled Ibuprofen and Tylenol and states it helps her for short periods of time. She states the "little white pill" Oxy 5 mg, makes her feel much better. Perineal area assessed, appears to be less swollen, no edema or signs of hematoma, neg Homan sign in calf or legs and fundas firm. Encouraged getting OOB, walking and taking a shower. Patient states she is too sore to that, maybe later. FOB getting up and providing most of the baby care task. Mother did bottle feed baby when RN gave her the baby and appears to be well bonded with her daughter.

## 2019-12-17 MED ORDER — OXYCODONE HCL 5 MG PO TABS
5.0000 mg | ORAL_TABLET | ORAL | 0 refills | Status: DC | PRN
Start: 2019-12-17 — End: 2020-08-26

## 2019-12-17 MED ORDER — ACETAMINOPHEN 325 MG PO TABS
650.0000 mg | ORAL_TABLET | Freq: Four times a day (QID) | ORAL | Status: DC
Start: 2019-12-17 — End: 2020-09-22

## 2019-12-17 MED ORDER — IBUPROFEN 600 MG PO TABS
600.0000 mg | ORAL_TABLET | Freq: Three times a day (TID) | ORAL | 0 refills | Status: DC
Start: 2019-12-17 — End: 2020-08-26

## 2019-12-17 MED ORDER — SENNOSIDES-DOCUSATE SODIUM 8.6-50 MG PO TABS: 2.0000 | ORAL_TABLET | ORAL | Status: DC

## 2019-12-17 NOTE — Progress Notes (Signed)
PT in to see patient gave her an abdominal binder to help her walk. She is OK to go home.

## 2019-12-17 NOTE — Progress Notes (Signed)
Patient states her pain is a 2 . She stated she is walking better. However MD paged about her wanting narcotics to go home with. MD stated to let PT work with her then she is Ok to go home. PT paged and left message regarding that the Patient needed to be seen before DC.

## 2019-12-17 NOTE — Evaluation (Signed)
Physical Therapy Evaluation Patient Details Name: Jessica Ewing MRN: [id number] DOB: 08-28-1980 Today's Date: 12/17/2019   History of Present Illness  Pt adm for vaginal delivery of baby on 9/6. Post delivery pt with pelvic pain that limited mobility.   Clinical Impression  Pt presents to PT with decr mobility due to pain at the pubic symphysis after vaginal delivery. Pt reports pain and mobility have improved with pain meds since yesterday. Pt able to mobilize adequately to return home. Placed abdominal binder around pelvis for compression with improvement in pain and mobility. Expect pt will make steady progress at home as pain improves. Of note pt reports with her first child she could not walk for the first day but that it improved on the second day with pain meds and that after 1 week she could walk without difficulty.     Follow Up Recommendations No PT follow up    Equipment Recommendations  Other (comment) (obtained abdominal binder)    Recommendations for Other Services       Precautions / Restrictions Precautions Precautions: None      Mobility  Bed Mobility Overal bed mobility: Modified Independent             General bed mobility comments: Incr time and use of hands to assist LE's off of bed  Transfers Overall transfer level: Modified independent Equipment used: None             General transfer comment: Incr time and effort  Ambulation/Gait Ambulation/Gait assistance: Modified independent (Device/Increase time) Gait Distance (Feet): 40 Feet Assistive device: None Gait Pattern/deviations: Step-through pattern;Decreased stride length;Antalgic Gait velocity: decr Gait velocity interpretation: <1.31 ft/sec, indicative of household ambulator General Gait Details: Pt with incr effort required to advance each lower extremity due to pain. Improved with manual compression to lateral pelvis. Placed abdominal binder around hips for compression with improvement.    Stairs            Wheelchair Mobility    Modified Rankin (Stroke Patients Only)       Balance Overall balance assessment: No apparent balance deficits (not formally assessed)                                           Pertinent Vitals/Pain Pain Assessment: 0-10 Pain Score: 4  Pain Location: pubic symphysis Pain Descriptors / Indicators: Sore;Guarding;Grimacing Pain Intervention(s): Limited activity within patient's tolerance;Repositioned;Heat applied    Home Living Family/patient expects to be discharged to:: Private residence Living Arrangements: Spouse/significant other;Children Available Help at Discharge: Family Type of Home: House       Home Layout: One level Home Equipment: None      Prior Function Level of Independence: Independent               Hand Dominance        Extremity/Trunk Assessment   Upper Extremity Assessment Upper Extremity Assessment: Overall WFL for tasks assessed    Lower Extremity Assessment Lower Extremity Assessment: RLE deficits/detail;LLE deficits/detail RLE Deficits / Details: limited by pelvic pain LLE Deficits / Details: limited by pelvic pain       Communication   Communication: No difficulties  Cognition Arousal/Alertness: Awake/alert Behavior During Therapy: WFL for tasks assessed/performed Overall Cognitive Status: Within Functional Limits for tasks assessed  General Comments      Exercises     Assessment/Plan    PT Assessment Patent does not need any further PT services  PT Problem List         PT Treatment Interventions      PT Goals (Current goals can be found in the Care Plan section)  Acute Rehab PT Goals PT Goal Formulation: All assessment and education complete, DC therapy    Frequency     Barriers to discharge        Co-evaluation               AM-PAC PT "6 Clicks" Mobility  Outcome Measure  Help needed turning from your back to your side while in a flat bed without using bedrails?: None Help needed moving from lying on your back to sitting on the side of a flat bed without using bedrails?: None Help needed moving to and from a bed to a chair (including a wheelchair)?: None Help needed standing up from a chair using your arms (e.g., wheelchair or bedside chair)?: None Help needed to walk in hospital room?: None Help needed climbing 3-5 steps with a railing? : A Little 6 Click Score: 23    End of Session   Activity Tolerance: Patient limited by pain Patient left: Other (comment) (standing at bedside) Nurse Communication: Mobility status PT Visit Diagnosis: Other abnormalities of gait and mobility (R26.89);Pain Pain - part of body:  (pelvic)    Time: 0902-0919 PT Time Calculation (min) (ACUTE ONLY): 17 min   Charges:   PT Evaluation $PT Eval Low Complexity: 1 Low          [facility] PT Acute Rehabilitation Services Pager [phone] Office [phone]   Angelina Ok [facility] 12/17/2019, 9:52 AM

## 2019-12-19 ENCOUNTER — Encounter: Payer: Medicaid Other | Admitting: Advanced Practice Midwife

## 2020-01-09 DIAGNOSIS — Z419 Encounter for procedure for purposes other than remedying health state, unspecified: Secondary | ICD-10-CM | POA: Diagnosis not present

## 2020-01-20 ENCOUNTER — Other Ambulatory Visit: Payer: Self-pay

## 2020-01-20 ENCOUNTER — Ambulatory Visit (INDEPENDENT_AMBULATORY_CARE_PROVIDER_SITE_OTHER): Payer: Medicaid Other | Admitting: Obstetrics and Gynecology

## 2020-01-20 ENCOUNTER — Encounter: Payer: Self-pay | Admitting: Obstetrics and Gynecology

## 2020-01-20 NOTE — Progress Notes (Signed)
    Post Partum Visit Note  Jessica Ewing is a 39 y.o. G64P2002 female who presents for a postpartum visit. She is 5 weeks postpartum following a normal spontaneous vaginal delivery.  I have fully reviewed the prenatal and intrapartum course. The delivery was at 38.4 gestational weeks.  Anesthesia: epidural. Postpartum course has been doing well. Baby is doing well. Baby is feeding by bottle Rush Barer goodstart. Bleeding no bleeding. Bowel function is normal. Bladder function is normal. Patient is not sexually active. Contraception method is abstinence. Pt states does not want BC at this time. Postpartum depression screening: negative, score 5.   The pregnancy intention screening data noted above was reviewed. Potential methods of contraception were discussed. The patient elected to proceed with Abstinence.      The following portions of the patient's history were reviewed and updated as appropriate: allergies, current medications, past family history, past medical history, past social history, past surgical history and problem list.  Review of Systems A comprehensive review of systems was negative.    Objective:  Last menstrual period 03/20/2019, unknown if currently breastfeeding.  General:  alert and cooperative   Breasts:  inspection negative, no nipple discharge or bleeding, no masses or nodularity palpable  Lungs: clear to auscultation bilaterally  Heart:  regular rate and rhythm, S1, S2 normal, no murmur, click, rub or gallop  Abdomen: soft, non-tender; bowel sounds normal; no masses,  no organomegaly   Vulva:  not evaluated  Vagina: not evaluated  Cervix:  not evaluated  Corpus: not examined  Adnexa:  not evaluated  Rectal Exam: Not performed.        Assessment:    Normal postpartum exam. Pap smear not done at today's visit.   Plan:   Essential components of care per ACOG recommendations:  1.  Mood and well being: Patient with negative depression screening today.  Reviewed local resources for support.  - Patient does not use tobacco. - hx of drug use? No   2. Infant care and feeding:  -Patient currently breastmilk feeding? No  -Social determinants of health (SDOH) reviewed in EPIC. No concerns  3. Sexuality, contraception and birth spacing - Patient does not want a pregnancy in the next year.  Desired family size is unknown number of children.  - Reviewed forms of contraception in tiered fashion. Patient desired abstinence today.   - Discussed birth spacing of 18 months  4. Sleep and fatigue -Encouraged family/partner/community support of 4 hrs of uninterrupted sleep to help with mood and fatigue  5. Physical Recovery  - Discussed patients delivery  - Patient had a first degree laceration, perineal healing reviewed. Patient expressed understanding - Patient has urinary incontinence? No  - Patient is safe to resume physical and sexual activity  6.  Health Maintenance - Last pap smear done 06/2019 and was normal with negative HPV.   Johnny Bridge, MDCenter for [employer], [facility]

## 2020-02-09 DIAGNOSIS — Z419 Encounter for procedure for purposes other than remedying health state, unspecified: Secondary | ICD-10-CM | POA: Diagnosis not present

## 2020-03-10 DIAGNOSIS — Z419 Encounter for procedure for purposes other than remedying health state, unspecified: Secondary | ICD-10-CM | POA: Diagnosis not present

## 2020-04-10 DIAGNOSIS — Z419 Encounter for procedure for purposes other than remedying health state, unspecified: Secondary | ICD-10-CM | POA: Diagnosis not present

## 2020-05-11 DIAGNOSIS — Z419 Encounter for procedure for purposes other than remedying health state, unspecified: Secondary | ICD-10-CM | POA: Diagnosis not present

## 2020-06-08 DIAGNOSIS — Z419 Encounter for procedure for purposes other than remedying health state, unspecified: Secondary | ICD-10-CM | POA: Diagnosis not present

## 2020-07-09 DIAGNOSIS — Z419 Encounter for procedure for purposes other than remedying health state, unspecified: Secondary | ICD-10-CM | POA: Diagnosis not present

## 2020-08-02 ENCOUNTER — Ambulatory Visit (INDEPENDENT_AMBULATORY_CARE_PROVIDER_SITE_OTHER): Payer: Medicaid Other

## 2020-08-02 ENCOUNTER — Other Ambulatory Visit: Payer: Self-pay

## 2020-08-02 DIAGNOSIS — N912 Amenorrhea, unspecified: Secondary | ICD-10-CM | POA: Diagnosis not present

## 2020-08-02 LAB — POCT URINE PREGNANCY: Preg Test, Ur: POSITIVE — AB

## 2020-08-02 MED ORDER — VITAFOL GUMMIES 3.33-0.333-34.8 MG PO CHEW: 3.0000 | CHEWABLE_TABLET | Freq: Every day | ORAL | 11 refills | Status: DC

## 2020-08-02 NOTE — Progress Notes (Signed)
Jessica Ewing presents today for UPT. She has no unusual complaints. Pt has a 54 month old.    LMP: 06/17/2020    OBJECTIVE: Appears well, in no apparent distress.  OB History    Gravida  3   Para  2   Term  2   Preterm      AB      Living  2     SAB      IAB      Ectopic      Multiple  0   Live Births  2          Home UPT Result: Positive  In-Office UPT result: Positive  I have reviewed the patient's medical, obstetrical, social, and family histories, and medications.   ASSESSMENT: Positive pregnancy test  PLAN Prenatal care to be completed at:  [facility]  PNV gummies rx sent to [facility] per pt's request

## 2020-08-08 DIAGNOSIS — Z419 Encounter for procedure for purposes other than remedying health state, unspecified: Secondary | ICD-10-CM | POA: Diagnosis not present

## 2020-08-26 ENCOUNTER — Ambulatory Visit (INDEPENDENT_AMBULATORY_CARE_PROVIDER_SITE_OTHER): Payer: Medicaid Other

## 2020-08-26 ENCOUNTER — Other Ambulatory Visit: Payer: Self-pay

## 2020-08-26 VITALS — BP 116/74 | HR 78 | Ht 62.0 in | Wt 117.6 lb

## 2020-08-26 DIAGNOSIS — Z3A01 Less than 8 weeks gestation of pregnancy: Secondary | ICD-10-CM | POA: Diagnosis not present

## 2020-08-26 DIAGNOSIS — O3680X0 Pregnancy with inconclusive fetal viability, not applicable or unspecified: Secondary | ICD-10-CM

## 2020-08-26 DIAGNOSIS — O219 Vomiting of pregnancy, unspecified: Secondary | ICD-10-CM

## 2020-08-26 DIAGNOSIS — Z3481 Encounter for supervision of other normal pregnancy, first trimester: Secondary | ICD-10-CM

## 2020-08-26 DIAGNOSIS — Z3687 Encounter for antenatal screening for uncertain dates: Secondary | ICD-10-CM | POA: Diagnosis not present

## 2020-08-26 DIAGNOSIS — O021 Missed abortion: Secondary | ICD-10-CM | POA: Diagnosis not present

## 2020-08-26 DIAGNOSIS — Z3491 Encounter for supervision of normal pregnancy, unspecified, first trimester: Secondary | ICD-10-CM | POA: Insufficient documentation

## 2020-08-26 MED ORDER — DICLEGIS 10-10 MG PO TBEC: 2.0000 | DELAYED_RELEASE_TABLET | Freq: Every day | ORAL | 2 refills | Status: DC

## 2020-08-26 NOTE — Progress Notes (Signed)
New OB Intake  I connected with  Jessica Ewing on 08/26/20 at  1:15 PM EDT by in person visit and verified that I am speaking with the correct person using two identifiers. Nurse is located at [facility] and pt is located at in office.  I discussed the limitations, risks, security and privacy concerns of performing an evaluation and management service by telephone and the availability of in person appointments. I also discussed with the patient that there may be a patient responsible charge related to this service. The patient expressed understanding and agreed to proceed.  I explained I am completing New OB Intake today. We discussed her EDD is unsure at this time. Patient will have repeat scan in 10 days.. Pt is G3/P2. I reviewed her allergies, medications, Medical/Surgical/OB history, and appropriate screenings. I informed her of [facility] services. Based on history, this is a/an uncomplicated pregnancy.  Patient Active Problem List   Diagnosis Date Noted  . Encounter for supervision of normal pregnancy in first trimester 08/26/2020  . Labor and delivery, indication for care 12/15/2019  . Spontaneous vaginal delivery 12/15/2019  . COVID-19 affecting pregnancy in second trimester 10/02/2019  . Supervision of other normal pregnancy, antepartum 05/12/2019    Concerns addressed today  Delivery Plans:  Plans to deliver at [facility].   MyChart/Babyscripts MyChart access verified. I explained pt will have some visits in office and some virtually. Babyscripts instructions given and order placed. Patient verifies receipt of registration text/e-mail. Account successfully created and app downloaded.  Blood Pressure Cuff Patient has BP cuff at home. Explained after first prenatal appt pt will check weekly and document in Babyscripts.  Anatomy US Explained first scheduled [place] will be around 19 weeks. Dating and Viability scan performed today.  Labs Discussed Avelina Laine genetic screening with patient. Would  like both Panorama and Horizon drawn at new OB visit. Routine prenatal labs needed.  Covid Vaccine Patient has not covid vaccine.   Social Determinants of Health . Food Insecurity: Patient denies food insecurity. . WIC Referral: Patient is interested in referral to [facility].  . Transportation: Patient denies transportation needs. . Childcare: Discussed no children allowed at ultrasound appointments. Offered childcare services; patient declines childcare services at this time.  First visit review I reviewed new OB appt with pt. I explained she will have a pelvic exam, ob bloodwork with genetic screening, and PAP smear. Explained pt will be seen by Sharen Counter at first visit; encounter routed to appropriate provider. Explained that patient will be seen by pregnancy navigator following visit with provider.  Hamilton Capri, RN 08/26/2020  1:34 PM

## 2020-08-26 NOTE — Progress Notes (Signed)
Agree with A & P. 

## 2020-08-27 LAB — BETA HCG QUANT (REF LAB): hCG Quant: 29660 m[IU]/mL

## 2020-08-31 ENCOUNTER — Encounter: Payer: Medicaid Other | Admitting: Advanced Practice Midwife

## 2020-09-01 ENCOUNTER — Inpatient Hospital Stay (HOSPITAL_COMMUNITY): Payer: Medicaid Other

## 2020-09-01 ENCOUNTER — Telehealth: Payer: Self-pay

## 2020-09-01 ENCOUNTER — Other Ambulatory Visit: Payer: Self-pay

## 2020-09-01 ENCOUNTER — Inpatient Hospital Stay (EMERGENCY_DEPARTMENT_HOSPITAL)
Admission: AD | Admit: 2020-09-01 | Discharge: 2020-09-02 | Disposition: A | Discharge status: Home / Self Care | Payer: Medicaid Other | Source: Home / Self Care | Attending: Obstetrics & Gynecology | Admitting: Obstetrics & Gynecology

## 2020-09-01 ENCOUNTER — Inpatient Hospital Stay (HOSPITAL_COMMUNITY)
Admission: EM | Admit: 2020-09-01 | Discharge: 2020-09-01 | Disposition: A | Discharge status: Home / Self Care | Payer: Medicaid Other | Attending: Obstetrics & Gynecology | Admitting: Obstetrics & Gynecology

## 2020-09-01 ENCOUNTER — Encounter (HOSPITAL_COMMUNITY): Payer: Self-pay | Admitting: Obstetrics & Gynecology

## 2020-09-01 ENCOUNTER — Encounter (HOSPITAL_COMMUNITY): Payer: Self-pay

## 2020-09-01 DIAGNOSIS — O26891 Other specified pregnancy related conditions, first trimester: Secondary | ICD-10-CM | POA: Diagnosis not present

## 2020-09-01 DIAGNOSIS — R109 Unspecified abdominal pain: Secondary | ICD-10-CM | POA: Insufficient documentation

## 2020-09-01 DIAGNOSIS — O039 Complete or unspecified spontaneous abortion without complication: Secondary | ICD-10-CM

## 2020-09-01 DIAGNOSIS — Z3A10 10 weeks gestation of pregnancy: Secondary | ICD-10-CM | POA: Insufficient documentation

## 2020-09-01 DIAGNOSIS — M549 Dorsalgia, unspecified: Secondary | ICD-10-CM | POA: Insufficient documentation

## 2020-09-01 DIAGNOSIS — Z79899 Other long term (current) drug therapy: Secondary | ICD-10-CM | POA: Diagnosis not present

## 2020-09-01 DIAGNOSIS — O200 Threatened abortion: Secondary | ICD-10-CM | POA: Insufficient documentation

## 2020-09-01 DIAGNOSIS — O09521 Supervision of elderly multigravida, first trimester: Secondary | ICD-10-CM | POA: Diagnosis not present

## 2020-09-01 DIAGNOSIS — O26851 Spotting complicating pregnancy, first trimester: Secondary | ICD-10-CM | POA: Diagnosis not present

## 2020-09-01 DIAGNOSIS — Z3A01 Less than 8 weeks gestation of pregnancy: Secondary | ICD-10-CM | POA: Diagnosis not present

## 2020-09-01 DIAGNOSIS — O209 Hemorrhage in early pregnancy, unspecified: Secondary | ICD-10-CM | POA: Diagnosis not present

## 2020-09-01 DIAGNOSIS — Z3481 Encounter for supervision of other normal pregnancy, first trimester: Secondary | ICD-10-CM

## 2020-09-01 DIAGNOSIS — N939 Abnormal uterine and vaginal bleeding, unspecified: Secondary | ICD-10-CM

## 2020-09-01 LAB — CBC
HCT: 41.1 % (ref 36.0–46.0)
Hemoglobin: 13.5 g/dL (ref 12.0–15.0)
MCH: 26.8 pg (ref 26.0–34.0)
MCHC: 32.8 g/dL (ref 30.0–36.0)
MCV: 81.7 fL (ref 80.0–100.0)
Platelets: 337 10*3/uL (ref 150–400)
RBC: 5.03 MIL/uL (ref 3.87–5.11)
RDW: 15.5 % (ref 11.5–15.5)
WBC: 9.8 10*3/uL (ref 4.0–10.5)
nRBC: 0 % (ref 0.0–0.2)

## 2020-09-01 LAB — URINALYSIS, ROUTINE W REFLEX MICROSCOPIC
Bilirubin Urine: NEGATIVE
Glucose, UA: NEGATIVE mg/dL
Ketones, ur: NEGATIVE mg/dL
Nitrite: NEGATIVE
Protein, ur: NEGATIVE mg/dL
Specific Gravity, Urine: 1.002 — ABNORMAL LOW (ref 1.005–1.030)
pH: 7 (ref 5.0–8.0)

## 2020-09-01 LAB — HCG, QUANTITATIVE, PREGNANCY: hCG, Beta Chain, Quant, S: 23480 m[IU]/mL — ABNORMAL HIGH (ref <5)

## 2020-09-01 NOTE — MAU Note (Signed)
Jessica Ewing is a 40 y.o. at [redacted]w[redacted]d here in MAU reporting: vaginal bleeding since Monday. States it is sometimes light pink and sometimes it is brown. Today it is brown. Having some back and abdominal pain.  Onset of complaint: ongoing  Pain score: abdominal pain 2/10, back pain 5/10  Vitals:   09/01/20 1244 09/01/20 1308  BP: (!) 156/80 132/71  Pulse: (!) 101 95  Resp: 18 16  Temp: 98.7 F (37.1 C) 98.1 F (36.7 C)  SpO2: 100% 100%     Lab orders placed from triage: UA

## 2020-09-01 NOTE — ED Provider Notes (Signed)
Emergency Medicine Provider Triage Evaluation Note  Jax Abdelrahman , a 40 y.o. female  was evaluated in triage.  Pt complains of vaginal bleeding that started yesterday. Pt is currently about [redacted] weeks pregnant.  Review of Systems  Positive: Vaginal bleeding, pelvic pain/back pain Negative: fever  Physical Exam  LMP 06/17/2020  Gen:   Awake, no distress   Resp:  Normal effort  MSK:   Moves extremities without difficulty   Medical Decision Making  Medically screening exam initiated at 12:40 PM.  Appropriate orders placed.  Kadience Macchi was informed that the remainder of the evaluation will be completed by another provider, this initial triage assessment does not replace that evaluation, and the importance of remaining in the ED until their evaluation is complete.  12:43 pm Spoke with Rayfield Citizen at [facility] who accepts pt to transfer to the ed.    Rayne Du 09/01/20 1244    Jacalyn Lefevre, MD 09/02/20 [phone]

## 2020-09-01 NOTE — Telephone Encounter (Signed)
Pt called w/concerns pt is early pregnant and states she has been bleeding for a few days and cramping Pt advised she would need evaluation and will need to go to the hospital since she is having pain and bleeding. Pt has U/S scheduled for next week.

## 2020-09-01 NOTE — MAU Provider Note (Signed)
History     CSN: [id number]  Arrival date and time: 09/01/20 1237   Event Date/Time   First Provider Initiated Contact with Patient 09/01/20 1557      Chief Complaint  Patient presents with  . Abdominal Pain  . Vaginal Bleeding   Jessica Ewing is a 39 y.o. G3P2002 at [redacted]w[redacted]d by Definite LMP.  She presents today for Abdominal Pain and Vaginal Bleeding.  She states she has been having abdominal cramping that "feels like a period."  Patient reports the cramping is intermittent and she has not taken anything for the pain.   She states she is also having bleeding, but it is "off and on." She states the bleeding is brown in color and without clots.  She states the bleeding is only noted with wiping when it does occur.     OB History    Gravida  3   Para  2   Term  2   Preterm      AB      Living  2     SAB      IAB      Ectopic      Multiple  0   Live Births  2           Past Medical History:  Diagnosis Date  . Medical history non-contributory     Past Surgical History:  Procedure Laterality Date  . NO PAST SURGERIES      No family history on file.  Social History   Tobacco Use  . Smoking status: Never Smoker  . Smokeless tobacco: Never Used  Vaping Use  . Vaping Use: Never used  Substance Use Topics  . Alcohol use: Never  . Drug use: Never    Allergies: No Known Allergies  Medications Prior to Admission  Medication Sig Dispense Refill Last Dose  . acetaminophen (TYLENOL) 325 MG tablet Take 2 tablets (650 mg total) by mouth every 6 (six) hours.     Marland Kitchen DICLEGIS 10-10 MG TBEC Take 2 tablets by mouth at bedtime. If symptoms persist, add one tablet in the morning and one in the afternoon 100 tablet 2   . Prenatal Vit-Fe Phos-FA-Omega (VITAFOL GUMMIES) 3.33-0.333-34.8 MG CHEW Chew 3 Doses by mouth daily. 90 tablet 11     Review of Systems  Gastrointestinal: Positive for abdominal pain (Cramping).  Genitourinary: Positive for vaginal bleeding.  Negative for difficulty urinating and dysuria.   Physical Exam   Blood pressure 132/71, pulse 95, temperature 98.1 F (36.7 C), temperature source Oral, resp. rate 16, height 5\' 2"  (1.575 m), weight 54.2 kg, last menstrual period 06/17/2020, SpO2 100 %, unknown if currently breastfeeding.  Physical Exam Vitals reviewed.  Constitutional:      Appearance: She is well-developed.  HENT:     Head: Normocephalic and atraumatic.  Eyes:     Conjunctiva/sclera: Conjunctivae normal.  Cardiovascular:     Rate and Rhythm: Normal rate.  Pulmonary:     Effort: Pulmonary effort is normal. No respiratory distress.  Musculoskeletal:        General: Normal range of motion.     Cervical back: Normal range of motion.  Neurological:     Mental Status: She is alert and oriented to person, place, and time.  Psychiatric:        Mood and Affect: Mood normal.        Behavior: Behavior normal.        Thought Content: Thought content normal.  MAU Course  Procedures Results for orders placed or performed during the hospital encounter of 09/01/20 (from the past 24 hour(s))  Urinalysis, Routine w reflex microscopic Urine, Clean Catch     Status: Abnormal   Collection Time: 09/01/20  1:15 PM  Result Value Ref Range   Color, Urine YELLOW YELLOW   APPearance HAZY (A) CLEAR   Specific Gravity, Urine 1.002 (L) 1.005 - 1.030   pH 7.0 5.0 - 8.0   Glucose, UA NEGATIVE NEGATIVE mg/dL   Hgb urine dipstick LARGE (A) NEGATIVE   Bilirubin Urine NEGATIVE NEGATIVE   Ketones, ur NEGATIVE NEGATIVE mg/dL   Protein, ur NEGATIVE NEGATIVE mg/dL   Nitrite NEGATIVE NEGATIVE   Leukocytes,Ua SMALL (A) NEGATIVE   RBC / HPF 6-10 0 - 5 RBC/hpf   WBC, UA 11-20 0 - 5 WBC/hpf   Bacteria, UA RARE (A) NONE SEEN   Squamous Epithelial / LPF 0-5 0 - 5   Mucus PRESENT   CBC     Status: None   Collection Time: 09/01/20  1:19 PM  Result Value Ref Range   WBC 9.8 4.0 - 10.5 K/uL   RBC 5.03 3.87 - 5.11 MIL/uL   Hemoglobin 13.5  12.0 - 15.0 g/dL   HCT 93.2 67.1 - 24.5 %   MCV 81.7 80.0 - 100.0 fL   MCH 26.8 26.0 - 34.0 pg   MCHC 32.8 30.0 - 36.0 g/dL   RDW 80.9 98.3 - 38.2 %   Platelets 337 150 - 400 K/uL   nRBC 0.0 0.0 - 0.2 %  hCG, quantitative, pregnancy     Status: Abnormal   Collection Time: 09/01/20  1:19 PM  Result Value Ref Range   hCG, Beta Chain, Quant, S 23,480 (H) <5 mIU/mL   US OB Transvaginal  Result Date: 09/01/2020 CLINICAL DATA:  Bleeding for 1 day, quantitative beta hCG 23,480 EXAM: TRANSVAGINAL OB ULTRASOUND TECHNIQUE: Transvaginal ultrasound was performed for complete evaluation of the gestation as well as the maternal uterus, adnexal regions, and pelvic cul-de-sac. COMPARISON:  Outpatient obstetrical ultrasound 08/26/2020 FINDINGS: Intrauterine gestational sac: Single Yolk sac:  Visualized. Embryo:  Visualized. Cardiac Activity: Not Visualized. Heart Rate:  bpm CRL:   4.1 mm   6 w 1 d                  [place] EDC: 04/26/2021 Subchorionic hemorrhage:  None visualized. Maternal uterus/adnexae: Anteverted maternal uterus. No concerning adnexal lesions. No pelvic free fluid. IMPRESSION: Intrauterine gestational sac with discernible fetal pole. At approximately 6 weeks 1 day by crown-rump length sonographic estimation. No detectable fetal cardiac activity. Of note, prior sonography performed 08/26/2020 demonstrates a an early fetal pole without cardiac activity with minimal progression in fetal size from the comparison exam. Due to the diminutive size of the fetal pole (<56mm), findings remain suspicious but not yet definitive for failed pregnancy. Recommend follow-up US in 10-14 days for definitive diagnosis. This recommendation follows SRU consensus guidelines: Diagnostic Criteria for Nonviable Pregnancy Early in the First Trimester. Malva Limes Med 2013; 505:3976-73. Electronically Signed   By: Kreg Shropshire M.D.   On: 09/01/2020 15:22    MDM Labs: CBC, hCG, UA Ultrasound Assessment and Plan  40 year old  G35P2002 Failed Pregnancy   -Patient labs and [place] completed from triage d/t acuity. -Results return and provider reviews [place]. -Patient informed of [place] and lab results. -Discussed that fetus has show little growth and hCG has decreased. -Informed that findings are likely Failed Pregnancy. -Patient  questions and concerns addressed. -Informed that she has an [place] scheduled for September 08, 2020 and patient states she would like to keep it. -Bleeding Precautions Given. -Encouraged to call or return to MAU if symptoms worsen or with the onset of new symptoms. -Discharged to home in stable condition.   Cherre Robins 09/01/2020, 3:57 PM

## 2020-09-01 NOTE — MAU Note (Signed)
Pt was seen earlier today and had BHCG and u/s. Had small amt bleeding at that time. Tonight bleeding has been heavier for about with lower back and abd pain.

## 2020-09-01 NOTE — MAU Provider Note (Signed)
Chief Complaint:  Abdominal Pain and Vaginal Bleeding  None    HPI: Jessica Ewing is a 40 y.o. G3P2002 at [redacted]w[redacted]d who presents to maternity admissions reporting vaginal bleeding. Patient was seen earlier today and was told that she was having a miscarriage. Patient reports that she was told that if she did not have any bleeding before her ultrasound on 6/1 then she would be given "a pill" to help her pass the pregnancy. Reports about 30 mins prior to her arrival tonight the bleeding got a little heavier. She has only changed her pad once and that was on arrival. She feels like "something is about to come out" as well as endorses some mild lower abdominal cramping and back pain.    Pregnancy Course:   Past Medical History:  Diagnosis Date  . Medical history non-contributory    OB History  Gravida Para Term Preterm AB Living  3 2 2     2   SAB IAB Ectopic Multiple Live Births        0 2    # Outcome Date GA Lbr Len/2nd Weight Sex Delivery Anes PTL Lv  3 Current           2 Term 12/15/19 [redacted]w[redacted]d 08:55 / 00:35 2906 g F Vag-Spont EPI  LIV     Birth Comments: [redacted]w[redacted]d  1 Term 09/23/09   2722 g F Vag-Spont EPI N LIV   Past Surgical History:  Procedure Laterality Date  . NO PAST SURGERIES     History reviewed. No pertinent family history. Social History   Tobacco Use  . Smoking status: Never Smoker  . Smokeless tobacco: Never Used  Vaping Use  . Vaping Use: Never used  Substance Use Topics  . Alcohol use: Never  . Drug use: Never   No Known Allergies Medications Prior to Admission  Medication Sig Dispense Refill Last Dose  . acetaminophen (TYLENOL) 325 MG tablet Take 2 tablets (650 mg total) by mouth every 6 (six) hours.     09/25/09 DICLEGIS 10-10 MG TBEC Take 2 tablets by mouth at bedtime. If symptoms persist, add one tablet in the morning and one in the afternoon 100 tablet 2   . Prenatal Vit-Fe Phos-FA-Omega (VITAFOL GUMMIES) 3.33-0.333-34.8 MG CHEW Chew 3 Doses by mouth daily. 90 tablet  11     I have reviewed patient's Past Medical Hx, Surgical Hx, Family Hx, Social Hx, medications and allergies.   ROS:  Review of Systems  Constitutional: Negative.   Respiratory: Negative.   Cardiovascular: Negative.   Gastrointestinal: Negative.   Genitourinary: Positive for pelvic pain and vaginal bleeding.  Musculoskeletal: Positive for back pain.  Neurological: Negative.   Psychiatric/Behavioral: Negative.     Physical Exam   Patient Vitals for the past 24 hrs:  BP Pulse Height Weight  09/01/20 2246 140/73 84 -- --  09/01/20 2244 -- -- 5\' 2"  (1.575 m) 54 kg   Constitutional: well-developed, well-nourished female in no acute distress.  Cardiovascular: normal rate Respiratory: normal effort GI: abd soft, non-tender MS: extremities nontender, no edema, normal ROM Neurologic: alert and oriented x 4.  GU: Neg CVAT. Pelvic: NEFG, small amount of blood pooling, few small clots removed, cervix visually appears dilated    Labs: Results for orders placed or performed during the hospital encounter of 09/01/20 (from the past 24 hour(s))  Urinalysis, Routine w reflex microscopic Urine, Clean Catch     Status: Abnormal   Collection Time: 09/01/20  1:15 PM  Result Value Ref  Range   Color, Urine YELLOW YELLOW   APPearance HAZY (A) CLEAR   Specific Gravity, Urine 1.002 (L) 1.005 - 1.030   pH 7.0 5.0 - 8.0   Glucose, UA NEGATIVE NEGATIVE mg/dL   Hgb urine dipstick LARGE (A) NEGATIVE   Bilirubin Urine NEGATIVE NEGATIVE   Ketones, ur NEGATIVE NEGATIVE mg/dL   Protein, ur NEGATIVE NEGATIVE mg/dL   Nitrite NEGATIVE NEGATIVE   Leukocytes,Ua SMALL (A) NEGATIVE   RBC / HPF 6-10 0 - 5 RBC/hpf   WBC, UA 11-20 0 - 5 WBC/hpf   Bacteria, UA RARE (A) NONE SEEN   Squamous Epithelial / LPF 0-5 0 - 5   Mucus PRESENT   CBC     Status: None   Collection Time: 09/01/20  1:19 PM  Result Value Ref Range   WBC 9.8 4.0 - 10.5 K/uL   RBC 5.03 3.87 - 5.11 MIL/uL   Hemoglobin 13.5 12.0 - 15.0  g/dL   HCT 16.141.1 09.636.0 - 04.546.0 %   MCV 81.7 80.0 - 100.0 fL   MCH 26.8 26.0 - 34.0 pg   MCHC 32.8 30.0 - 36.0 g/dL   RDW 40.915.5 81.111.5 - 91.415.5 %   Platelets 337 150 - 400 K/uL   nRBC 0.0 0.0 - 0.2 %  hCG, quantitative, pregnancy     Status: Abnormal   Collection Time: 09/01/20  1:19 PM  Result Value Ref Range   hCG, Beta Chain, Quant, S 23,480 (H) <5 mIU/mL    Imaging:  US OB Limited  Result Date: 08/28/2020 ----------------------------------------------------------------------  OBSTETRICS REPORT                       (Signed Final 08/28/2020 05:13 pm) ---------------------------------------------------------------------- Patient Info  ID #:       [id number]                          D.O.B.:  July 31, 1980 (39 yrs)  Name:       Jessica Ewing                    Visit Date: 08/26/2020 02:29 pm ---------------------------------------------------------------------- Performed By  Attending:        Nettie ElmMichael Ervin MD       Ref. Address:     [address]                                                             [address]                                                             [place]                                                             [id number]  Performed By:     Angelica PouAriel Burch RN  [facility]         [facility]                                                             [facility]  Referred By:      Hermina Staggers                    MD ---------------------------------------------------------------------- Orders  #  Description                           Code        Ordered By  1  US OB LIMITED                         33825.0     Nettie Elm ----------------------------------------------------------------------  #  Order #                     Accession #                Episode #  1  [id number]                   [id number]                 [id number]  ---------------------------------------------------------------------- Indications  Weeks of gestation of pregnancy not            Z3A.00  specified ---------------------------------------------------------------------- Fetal Evaluation  Num Of Fetuses:         1  Cardiac Activity:       Not visualized ---------------------------------------------------------------------- Biometry  GS:       17.4  mm     G. Age:  6w 5d                   EDD:   04/16/21  CRL:       3.6  mm     G. Age:  6w 0d                   EDD:   04/21/21 ---------------------------------------------------------------------- Comments  Technically limited exam due to early Gestational Age.  Measurements are not consistent with LMP provided. No  cardiac activity seen. Per MD repeat scan in 10 days to rule  out early pregnancy vs failed pregnancy. LMP 06/17/20 ---------------------------------------------------------------------- Impression  Enlarged gestation sac, no fetal pole noted ---------------------------------------------------------------------- Recommendations  Repeat U/S in 10 days for confirmation of IUP vs failed  pregnancy ----------------------------------------------------------------------  Nettie Elm, MD Electronically Signed Final Report   08/28/2020 05:13 pm ----------------------------------------------------------------------  US OB Transvaginal  Result Date: 09/01/2020 CLINICAL DATA:  Bleeding for 1 day, quantitative beta hCG 23,480 EXAM: TRANSVAGINAL OB ULTRASOUND TECHNIQUE: Transvaginal ultrasound was performed for complete evaluation of the gestation as well as the maternal uterus, adnexal regions, and pelvic cul-de-sac. COMPARISON:  Outpatient obstetrical ultrasound 08/26/2020 FINDINGS: Intrauterine gestational sac: Single Yolk sac:  Visualized. Embryo:  Visualized. Cardiac Activity: Not Visualized. Heart Rate:  bpm CRL:   4.1 mm   6 w 1 d                  [place] EDC: 04/26/2021 Subchorionic hemorrhage:   None visualized. Maternal uterus/adnexae: Anteverted maternal uterus. No concerning adnexal lesions. No pelvic free fluid. IMPRESSION: Intrauterine gestational sac with discernible fetal pole. At approximately 6 weeks 1 day by crown-rump length sonographic estimation. No detectable fetal cardiac activity. Of note, prior sonography performed 08/26/2020 demonstrates a an early fetal pole without cardiac activity with minimal progression in fetal size from the comparison exam. Due to the diminutive size of the fetal pole (<51mm), findings remain suspicious but not yet definitive for failed pregnancy. Recommend follow-up US in 10-14 days for definitive diagnosis. This recommendation follows SRU consensus guidelines: Diagnostic Criteria for Nonviable Pregnancy Early in the First Trimester. Malva Limes Med 2013; 161:0960-45. Electronically Signed   By: Kreg Shropshire M.D.   On: 09/01/2020 15:22    MAU Course: Orders Placed This Encounter  Procedures  . Discharge patient   Meds ordered this encounter  Medications  . misoprostol (CYTOTEC) 200 MCG tablet    Sig: Place four tablets vaginally. May repeat in 24 hours prn if no bleeding.    Dispense:  4 tablet    Refill:  1    Order Specific Question:   Supervising Provider    Answer:   Reva Bores [[id number]]  . ibuprofen (ADVIL) 800 MG tablet    Sig: Take 1 tablet (800 mg total) by mouth 3 (three) times daily with meals as needed for headache or moderate pain.    Dispense:  30 tablet    Refill:  3    Order Specific Question:   Supervising Provider    Answer:   Reva Bores [[id number]]  . promethazine (PHENERGAN) 25 MG tablet    Sig: Take 1 tablet (25 mg total) by mouth every 6 (six) hours as needed for nausea or vomiting.    Dispense:  30 tablet    Refill:  0    Order Specific Question:   Supervising Provider    Answer:   Samara Snide    MDM: Small amount of blood noted on pad Pelvic exam with small amount of vaginal bleeding and few small clots,  cervix appears visually dilated Discussed patient with Dr. Charlotta Newton. Given 2 previous ultrasound results and decreasing hcg level, patient meets criteria for failed pregnancy. Patient may have option for expectant management with plans to keep ultrasound appointment on 6/1, or may take Cytotec PV at home and repeat again in 24 hours if she does not pass POC.  After private discussion with husband, patient opts to have rx for Cytotec, however does not want to take it until she goes to ultrasound appointment.  Patient was instructed that increased bleeding and cramping were a normal part of the process, however we reviewed signs of bleeding too much. Patient was given strict return precautions.  Patient is to keep ultrasound  appointment on 6/1.   Rx for cytotec, ibuprofen, and promethazine sent to pharmacy  Assessment: Failed pregnancy  Plan: Discharge home in stable condition.  Strict bleeding and return precautions given Keep ultrasound appointment on 6/1 Return to MAU as needed for emergencies   Allergies as of 09/02/2020   No Known Allergies     Medication List    TAKE these medications   acetaminophen 325 MG tablet Commonly known as: Tylenol Take 2 tablets (650 mg total) by mouth every 6 (six) hours.   Diclegis 10-10 MG Tbec Generic drug: Doxylamine-Pyridoxine Take 2 tablets by mouth at bedtime. If symptoms persist, add one tablet in the morning and one in the afternoon   ibuprofen 800 MG tablet Commonly known as: ADVIL Take 1 tablet (800 mg total) by mouth 3 (three) times daily with meals as needed for headache or moderate pain.   misoprostol 200 MCG tablet Commonly known as: CYTOTEC Place four tablets vaginally. May repeat in 24 hours prn if no bleeding.   promethazine 25 MG tablet Commonly known as: PHENERGAN Take 1 tablet (25 mg total) by mouth every 6 (six) hours as needed for nausea or vomiting.   Vitafol Gummies 3.33-0.333-34.8 MG Chew Chew 3 Doses by mouth  daily.       Camelia Eng, MSN, CNM 09/01/2020 4:00 AM

## 2020-09-01 NOTE — ED Triage Notes (Signed)
Patient complains of 2-3 days of lower abdominal discomfort, back pain and light vaginal bleeding. G3, P2. Has had [place] to conform pregnancy. Patient in no distress

## 2020-09-01 NOTE — Discharge Instructions (Signed)
Threatened Miscarriage °A threatened miscarriage occurs when a woman has vaginal bleeding during the first 20 weeks of pregnancy but the pregnancy has not ended. If vaginal bleeding occurs during this time, the health care provider will do tests to make sure the woman is still pregnant. The woman's condition may be considered a threatened miscarriage if the tests show: °· That she is still pregnant. °· That the embryo or unborn baby (fetus) inside the uterus is still growing. °A threatened miscarriage does not mean your pregnancy will end, but it does increase the risk of losing your pregnancy (miscarriage). °What are the causes? °The cause of this condition is usually not known. °What increases the risk? °The following factors may make a pregnant woman more likely to have a miscarriage: °Certain medical conditions °· Conditions that affect the hormone balance in the body, such as thyroid disease or polycystic ovary syndrome. °· Diabetes. °· Autoimmune disorders. °· Infections. °· Bleeding disorders. °· Obesity. °Lifestyle factors °· Using products with tobacco or nicotine or being exposed to tobacco smoke. °· Having alcohol. °· Having large amounts of caffeine. °· Recreational drug use. °Problems with reproductive organs or structures °· Cervical insufficiency. This is when the the lowest part of the uterus (cervix) opens and thins before pregnancy is at term. °· Having a condition called Asherman syndrome, which causes scarring in the uterus or causes the uterus to be abnormal in structure. °· Fibrous growths, called fibroids, in the uterus. °· Congenital abnormalities. These problems are present at birth. °· Infection of the cervix or uterus. °Personal or medical history °· Injury (trauma). °· Having had a miscarriage before. °· Being younger than age 18 or older than age 35. °· Exposure to harmful substances in the environment. This may include radiation or heavy metals, such as lead. °· Using certain  medicines. °What are the signs or symptoms? °Symptoms of this condition include: °· Vaginal bleeding or spotting, with or without cramps or pain. °· Mild pain or cramps in your abdomen. °How is this diagnosed? °You may have tests to check whether you are still pregnant. These tests will be done if you have bleeding, with or without pain, in your abdomen before the 20th week of pregnancy. These tests include: °· Ultrasound. °· A physical exam. °· Measurement of your baby's heart rate. °· Lab tests, such as blood tests, urine tests, or swabs for infection. °You may be diagnosed with a threatened miscarriage if: °· Ultrasound testing shows that you are still pregnant. °· Your baby's heart rate is strong. °· A physical exam shows that your cervix is closed. °· Blood tests confirm that you are still pregnant.   °How is this treated? °No treatments have been shown to prevent a threatened miscarriage from going on to a complete miscarriage. However, the right home care is important. °Follow these instructions at home: °· Get plenty of rest. °· Do not have sex, douche, or put anything in your vagina, such as tampons, until your health care provider says it is okay. °· Do not smoke or use recreational drugs. °· Do not drink alcohol. °· Avoid caffeine. °· Keep all follow-up prenatal visits. This is important. °Contact a health care provider if: °· You have light vaginal bleeding or spotting while pregnant. °· You have pain or cramping in your abdomen. °· You have a fever. °Get help right away if: °· Heavy bleeding soaks through 2 large sanitary pads an hour for more than 2 hours. °· Blood clots come out of   your vagina. °· Tissue comes out of your vagina. °· You leak fluid, or you have a gush of fluid from your vagina. °· You have severe low back pain or cramps in your abdomen. °· You have a fever, chills, and severe pain in the abdomen. °Summary °· A threatened miscarriage occurs when a woman bleeds from the vagina during the  first 20 weeks of pregnancy but the pregnancy has not ended. °· The cause of a threatened miscarriage is usually not known. °· Symptoms of this condition may include vaginal bleeding and mild pain or cramps in your abdomen. °· No treatments have been shown to prevent a threatened miscarriage from going on to a complete miscarriage. °· Keep all follow-up prenatal visits. This is important. °This information is not intended to replace advice given to you by your health care provider. Make sure you discuss any questions you have with your health care provider. °Document Revised: 09/26/2019 Document Reviewed: 09/26/2019 °Elsevier Patient Education © 2021 Elsevier Inc. ° °

## 2020-09-02 DIAGNOSIS — O039 Complete or unspecified spontaneous abortion without complication: Secondary | ICD-10-CM

## 2020-09-02 MED ORDER — PROMETHAZINE HCL 25 MG PO TABS: 25.0000 mg | ORAL_TABLET | Freq: Four times a day (QID) | ORAL | 0 refills | Status: DC | PRN

## 2020-09-02 MED ORDER — IBUPROFEN 800 MG PO TABS: 800.0000 mg | ORAL_TABLET | Freq: Three times a day (TID) | ORAL | 3 refills | Status: DC | PRN

## 2020-09-02 MED ORDER — MISOPROSTOL 200 MCG PO TABS: ORAL_TABLET | ORAL | 1 refills | Status: DC

## 2020-09-02 NOTE — Progress Notes (Signed)
Written and verbal d/c instructions given by Tyna Jaksch RN

## 2020-09-02 NOTE — Discharge Instructions (Signed)
Threatened Miscarriage A threatened miscarriage is when a woman bleeds in her vagina during the first 20 weeks of pregnancy but the pregnancy has not ended. The doctor will do tests to make sure you are still pregnant. This condition does not mean your pregnancy will end, but it does increase the risk that it will end (miscarriage). What are the causes? Normally, the cause of this condition is not known. What increases the risk? These things may make a pregnant woman more likely to lose a pregnancy: Certain health problems  Conditions that affect hormones, such as thyroid disease or polycystic ovary syndrome.  Diabetes.  Disorders that cause the body's disease-fighting system to attack itself by mistake.  Infections.  Bleeding problems.  Being very overweight. Lifestyle factors  Using products that have tobacco or nicotine in them.  Being around tobacco smoke.  Having a lot of caffeine.  Using drugs. Problems with reproductive organs or parts  Having a cervix that opens and thins before you are ready to give birth. The cervix is the lowest part of the womb.  Having Asherman syndrome, which leads to: ? Scars in the womb. ? The womb being an abnormal shape.  Growths (fibroids) in the womb.  Problems in the body that are present at birth.  Infection of the cervix or womb. Personal or health history  Injury.  Having lost an unborn baby before.  Being younger than age 18 or older than age 35.  Being around a harmful substance, such as radiation.  Having lead or other heavy metals in: ? Things you eat or drink. ? The air around you.  Using certain medicines. What are the signs or symptoms?  Bleeding from the vagina. You may also have cramps or pain.  Mild pain or cramps in your belly. How is this diagnosed?  The doctor will do tests, such as an ultrasound to make sure you are still pregnant.   How is this treated? There are no treatments that prevent loss of  pregnancy. But you need to do the right things to take care of yourself at home. Follow these instructions at home:  Get a lot of rest.  Do not have sex or douche if there is bleeding in the vagina.  Do not put things such as tampons in the vagina if it is bleeding.  Do not smoke or use drugs.  Do not drink alcohol.  Avoid caffeine.  Keep all follow-up visits while you are pregnant. Contact a doctor if:  You are pregnant and you have one of these: ? Light bleeding coming from your vagina. ? Spots of blood coming from your vagina.  You have belly pain or cramping.  You have a fever. Get help right away if:  Blood soaks through 2 large pads an hour for more than 2 hours.  Clots of blood come from your vagina.  Tissue comes out of your vagina.  Fluid leaks or gushes from your vagina.  You have very bad pain in your low back.  You have very bad cramps in your belly.  You have a fever, chills, and very bad belly pain. Summary  A threatened miscarriage is when a woman bleeds in her vagina during the first 20 weeks of pregnancy but the pregnancy has not ended.  Normally, the cause of this condition is not known.  Symptoms include bleeding in the vagina or mild pain or cramps in your belly.  There are no treatments that prevent loss of pregnancy.  Keep   all follow-up visits while you are pregnant. This information is not intended to replace advice given to you by your health care provider. Make sure you discuss any questions you have with your health care provider. Document Revised: 09/26/2019 Document Reviewed: 09/26/2019 Elsevier Patient Education  2021 Elsevier Inc.  

## 2020-09-08 ENCOUNTER — Other Ambulatory Visit: Payer: Self-pay

## 2020-09-08 ENCOUNTER — Other Ambulatory Visit: Payer: Self-pay | Admitting: Obstetrics and Gynecology

## 2020-09-08 ENCOUNTER — Ambulatory Visit
Admission: RE | Admit: 2020-09-08 | Discharge: 2020-09-08 | Disposition: A | Discharge status: Home / Self Care | Payer: Medicaid Other | Source: Ambulatory Visit | Attending: Obstetrics and Gynecology | Admitting: Obstetrics and Gynecology

## 2020-09-08 ENCOUNTER — Ambulatory Visit (INDEPENDENT_AMBULATORY_CARE_PROVIDER_SITE_OTHER): Payer: Medicaid Other

## 2020-09-08 VITALS — BP 136/76 | HR 75 | Wt 120.0 lb

## 2020-09-08 DIAGNOSIS — Z419 Encounter for procedure for purposes other than remedying health state, unspecified: Secondary | ICD-10-CM | POA: Diagnosis not present

## 2020-09-08 DIAGNOSIS — O3680X0 Pregnancy with inconclusive fetal viability, not applicable or unspecified: Secondary | ICD-10-CM | POA: Diagnosis not present

## 2020-09-08 DIAGNOSIS — Z3687 Encounter for antenatal screening for uncertain dates: Secondary | ICD-10-CM

## 2020-09-08 DIAGNOSIS — O039 Complete or unspecified spontaneous abortion without complication: Secondary | ICD-10-CM | POA: Diagnosis not present

## 2020-09-08 DIAGNOSIS — Z3A00 Weeks of gestation of pregnancy not specified: Secondary | ICD-10-CM | POA: Diagnosis not present

## 2020-09-08 DIAGNOSIS — R9389 Abnormal findings on diagnostic imaging of other specified body structures: Secondary | ICD-10-CM | POA: Diagnosis not present

## 2020-09-08 DIAGNOSIS — Z712 Person consulting for explanation of examination or test findings: Secondary | ICD-10-CM

## 2020-09-08 NOTE — Progress Notes (Signed)
Here today for results following [place] to assess for completion of miscarriage. Pt had confirmed miscarriage in MAU on 09/01/20. Results reviewed with Alysia Penna, MD who states miscarriage appears complete and pt should follow up in office at [facility] for further care.   Results reviewed with pt. Pt denies any bleeding or pain. Reports not taking Cytotec that was prescribed as she continued to bleed on her own at the time of visit to MAU. I explained pt does not need this medication now. Pt has various questions about occurrence of miscarriage and future pregnancies. Pt agreeable to appt with Alysia Penna, MD in the next few weeks to assess emotional/physical well being and to discuss concerns.  Fleet Contras RN 09/08/20

## 2020-09-08 NOTE — Progress Notes (Signed)
Agree with A & P. 

## 2020-09-22 ENCOUNTER — Encounter: Payer: Self-pay | Admitting: Obstetrics and Gynecology

## 2020-09-22 ENCOUNTER — Ambulatory Visit (INDEPENDENT_AMBULATORY_CARE_PROVIDER_SITE_OTHER): Payer: Medicaid Other | Admitting: Obstetrics and Gynecology

## 2020-09-22 ENCOUNTER — Other Ambulatory Visit: Payer: Self-pay

## 2020-09-22 DIAGNOSIS — O021 Missed abortion: Secondary | ICD-10-CM

## 2020-09-22 NOTE — Progress Notes (Signed)
Jessica Ewing is her today for follow up of SAB. Seen in MAU on 09/02/2020 and given Rx for Cytotec. However pt did not take Had a few days bleeding afterwards, but has stopped now.  Blood type O +  PE AF VSS Lungs clear Heart RRR Abd soft + BS  A/P MAB  Will check BHCG today. Additional testing as per results. F/U as per results as well. Considering OCP's for contraception.

## 2020-09-22 NOTE — Progress Notes (Signed)
Patient presents for F/U SAB.   Denies any pain or bleeding today.   Pt wants to discuss different contraception options. Pt considering a pill.

## 2020-09-23 LAB — BETA HCG QUANT (REF LAB): hCG Quant: 13 m[IU]/mL

## 2020-09-24 ENCOUNTER — Telehealth: Payer: Self-pay

## 2020-09-24 NOTE — Telephone Encounter (Signed)
Patient reviewed test results via my-chart. Lab appt schedule for 10/07/2020.

## 2020-09-24 NOTE — Telephone Encounter (Signed)
-----   Message from Hermina Staggers, MD sent at 09/23/2020 10:36 AM EDT ----- Please repeat BHCG, non stat, in 2 weeks Please contact pt with appt   Thanks Casimiro Needle

## 2020-10-07 ENCOUNTER — Other Ambulatory Visit: Payer: Medicaid Other

## 2020-10-07 ENCOUNTER — Telehealth: Payer: Self-pay

## 2020-10-07 NOTE — Telephone Encounter (Signed)
Power outage, call to cancel/reschedule appointment, no answer, left message to give [place] a call back

## 2020-10-08 DIAGNOSIS — Z419 Encounter for procedure for purposes other than remedying health state, unspecified: Secondary | ICD-10-CM | POA: Diagnosis not present

## 2020-10-26 ENCOUNTER — Other Ambulatory Visit: Payer: Medicaid Other

## 2020-10-26 ENCOUNTER — Other Ambulatory Visit: Payer: Self-pay

## 2020-10-26 DIAGNOSIS — O021 Missed abortion: Secondary | ICD-10-CM | POA: Diagnosis not present

## 2020-10-27 LAB — BETA HCG QUANT (REF LAB): hCG Quant: <1 m[IU]/mL

## 2020-11-08 ENCOUNTER — Ambulatory Visit (INDEPENDENT_AMBULATORY_CARE_PROVIDER_SITE_OTHER): Payer: Medicaid Other | Admitting: Obstetrics and Gynecology

## 2020-11-08 ENCOUNTER — Encounter: Payer: Self-pay | Admitting: Obstetrics and Gynecology

## 2020-11-08 ENCOUNTER — Other Ambulatory Visit: Payer: Self-pay

## 2020-11-08 VITALS — BP 116/73 | HR 80 | Wt 121.0 lb

## 2020-11-08 DIAGNOSIS — Z3009 Encounter for other general counseling and advice on contraception: Secondary | ICD-10-CM | POA: Diagnosis not present

## 2020-11-08 DIAGNOSIS — Z419 Encounter for procedure for purposes other than remedying health state, unspecified: Secondary | ICD-10-CM | POA: Diagnosis not present

## 2020-11-08 LAB — POCT URINE PREGNANCY: Preg Test, Ur: NEGATIVE

## 2020-11-08 MED ORDER — XULANE 150-35 MCG/24HR TD PTWK: 1.0000 | MEDICATED_PATCH | TRANSDERMAL | 12 refills | Status: DC

## 2020-11-08 NOTE — Progress Notes (Signed)
RGYN patient to discuss contraception. Pt denies any unprotected intercourse x 14 days.   Pt considering Depo.   LMP: 10/30/20  Pt wants Rx PNV sent .

## 2020-11-08 NOTE — Progress Notes (Signed)
40 yo P2 with LMP 10/30/20 who is here for contraception counseling. Patient reports a monthly period lasting 5 days. She denies pelvic pain or abnormal discharge. Patient is sexually active using condoms. She is without any other complaints  Past Medical History:  Diagnosis Date   Medical history non-contributory    Past Surgical History:  Procedure Laterality Date   NO PAST SURGERIES     No family history on file. Social History   Tobacco Use   Smoking status: Never   Smokeless tobacco: Never  Vaping Use   Vaping Use: Never used  Substance Use Topics   Alcohol use: Never   Drug use: Never   ROS See pertinent in HPI. All other systems reviewed and non contributory  Blood pressure 116/73, pulse 80, weight 121 lb (54.9 kg), last menstrual period 10/30/2020, unknown if currently breastfeeding. GENERAL: Well-developed, well-nourished female in no acute distress.  NEURO: alert and oriented x 3  A/P 40 yo P2 here for contraception counseling - Contraception options reviewed with patient - Medical history reviewed and no contraindication to combined hormonal contraception - Patient opted for patch- rx e-prescribed - patient with normal pap smear 06/2019 - RTC prn

## 2020-12-09 DIAGNOSIS — Z419 Encounter for procedure for purposes other than remedying health state, unspecified: Secondary | ICD-10-CM | POA: Diagnosis not present

## 2020-12-21 ENCOUNTER — Other Ambulatory Visit: Payer: Self-pay

## 2020-12-21 ENCOUNTER — Ambulatory Visit (INDEPENDENT_AMBULATORY_CARE_PROVIDER_SITE_OTHER): Payer: Medicaid Other

## 2020-12-21 VITALS — BP 128/78 | HR 72

## 2020-12-21 DIAGNOSIS — Z3200 Encounter for pregnancy test, result unknown: Secondary | ICD-10-CM

## 2020-12-21 DIAGNOSIS — Z3201 Encounter for pregnancy test, result positive: Secondary | ICD-10-CM | POA: Diagnosis not present

## 2020-12-21 LAB — POCT URINE PREGNANCY: Preg Test, Ur: POSITIVE — AB

## 2020-12-21 NOTE — Progress Notes (Signed)
Jessica Ewing presents today for UPT. She has no unusual complaints. LMP: 11/01/2020    OBJECTIVE: Appears well, in no apparent distress.  OB History     Gravida  3   Para  2   Term  2   Preterm      AB      Living  2      SAB      IAB      Ectopic      Multiple  0   Live Births  2          Home UPT Result: positive In-Office UPT result: positive I have reviewed the patient's medical, obstetrical, social, and family histories, and medications.   ASSESSMENT: Positive pregnancy test  PLAN Prenatal care to be completed at: [facility] around 10 weeks.

## 2021-01-05 ENCOUNTER — Other Ambulatory Visit: Payer: Self-pay

## 2021-01-05 ENCOUNTER — Ambulatory Visit (INDEPENDENT_AMBULATORY_CARE_PROVIDER_SITE_OTHER): Payer: Medicaid Other

## 2021-01-05 VITALS — BP 118/68 | HR 76 | Ht 63.0 in | Wt 114.4 lb

## 2021-01-05 DIAGNOSIS — O3680X0 Pregnancy with inconclusive fetal viability, not applicable or unspecified: Secondary | ICD-10-CM

## 2021-01-05 DIAGNOSIS — O0990 Supervision of high risk pregnancy, unspecified, unspecified trimester: Secondary | ICD-10-CM | POA: Insufficient documentation

## 2021-01-05 DIAGNOSIS — Z3481 Encounter for supervision of other normal pregnancy, first trimester: Secondary | ICD-10-CM

## 2021-01-05 DIAGNOSIS — O219 Vomiting of pregnancy, unspecified: Secondary | ICD-10-CM

## 2021-01-05 DIAGNOSIS — Z3A10 10 weeks gestation of pregnancy: Secondary | ICD-10-CM

## 2021-01-05 DIAGNOSIS — Z3491 Encounter for supervision of normal pregnancy, unspecified, first trimester: Secondary | ICD-10-CM | POA: Insufficient documentation

## 2021-01-05 MED ORDER — DICLEGIS 10-10 MG PO TBEC: 2.0000 | DELAYED_RELEASE_TABLET | Freq: Every day | ORAL | 5 refills | Status: DC

## 2021-01-05 NOTE — Progress Notes (Signed)
Agree with A & P. 

## 2021-01-05 NOTE — Progress Notes (Signed)
New OB Intake  I connected with  Jessica Ewing on 01/05/21 at 10:15 AM EDT by in person. Video Visit and verified that I am speaking with the correct person using two identifiers. Nurse is located at [facility] and pt is located at [place].  I discussed the limitations, risks, security and privacy concerns of performing an evaluation and management service by telephone and the availability of in person appointments. I also discussed with the patient that there may be a patient responsible charge related to this service. The patient expressed understanding and agreed to proceed.  I explained I am completing New OB Intake today. We discussed her EDD of 07/28/21 that is based on early u/s. Pt is G4/P2012. I reviewed her allergies, medications, Medical/Surgical/OB history, and appropriate screenings. I informed her of [facility] services. Based on history, this is a/an  pregnancy uncomplicated .   Patient Active Problem List   Diagnosis Date Noted   Missed ab 09/22/2020    Concerns addressed today  Delivery Plans:  Plans to deliver at [facility].   MyChart/Babyscripts MyChart access verified. I explained pt will have some visits in office and some virtually. Babyscripts instructions given and order placed. Patient verifies receipt of registration text/e-mail. Account successfully created and app downloaded.  Blood Pressure Cuff  Patient still has one from last pregancy. Explained after first prenatal appt pt will check weekly and document in Babyscripts.  Weight scale: Patient does have weight scale.  Anatomy US Explained first scheduled [place] will be around 19 weeks. Dating and viability scan completed today  Labs Discussed Avelina Laine genetic screening with patient. Would like both Panorama and Horizon drawn at new OB visit. Routine prenatal labs needed.  Covid Vaccine Patient has not covid vaccine.   Mother/ Baby Dyad Candidate?    If yes, offer as possibility  Informed patient of [facility]  website  and placed link in her AVS.   Social Determinants of Health Food Insecurity: Patient denies food insecurity. WIC Referral: Patient is interested in referral to [facility].  Transportation: Patient denies transportation needs. Childcare: Discussed no children allowed at ultrasound appointments. Offered childcare services; patient declines childcare services at this time.  Send link to Pregnancy Navigators   Placed OB Box on problem list and updated  First visit review I reviewed new OB appt with pt. I explained she will have a pelvic exam, ob bloodwork with genetic screening, and PAP smear. Explained pt will be seen by Scheryl Darter at first visit; encounter routed to appropriate provider. Explained that patient will be seen by pregnancy navigator following visit with provider. [facility] information placed in AVS.   Hamilton Capri, RN 01/05/2021  10:05 AM

## 2021-01-08 DIAGNOSIS — Z419 Encounter for procedure for purposes other than remedying health state, unspecified: Secondary | ICD-10-CM | POA: Diagnosis not present

## 2021-01-19 ENCOUNTER — Ambulatory Visit (INDEPENDENT_AMBULATORY_CARE_PROVIDER_SITE_OTHER): Payer: Medicaid Other | Admitting: Obstetrics & Gynecology

## 2021-01-19 ENCOUNTER — Other Ambulatory Visit (HOSPITAL_COMMUNITY)
Admission: RE | Admit: 2021-01-19 | Discharge: 2021-01-19 | Disposition: A | Discharge status: Home / Self Care | Payer: Medicaid Other | Source: Ambulatory Visit | Attending: Obstetrics & Gynecology | Admitting: Obstetrics & Gynecology

## 2021-01-19 ENCOUNTER — Other Ambulatory Visit: Payer: Self-pay

## 2021-01-19 DIAGNOSIS — O09522 Supervision of elderly multigravida, second trimester: Secondary | ICD-10-CM | POA: Diagnosis not present

## 2021-01-19 DIAGNOSIS — O0990 Supervision of high risk pregnancy, unspecified, unspecified trimester: Secondary | ICD-10-CM

## 2021-01-19 DIAGNOSIS — O09529 Supervision of elderly multigravida, unspecified trimester: Secondary | ICD-10-CM | POA: Insufficient documentation

## 2021-01-19 NOTE — Progress Notes (Signed)
  Subjective:US date recalculated    Jessica Ewing is a [id number] [redacted]w[redacted]d being seen today for her first obstetrical visit.  Her obstetrical history is significant for advanced maternal age. Patient does intend to breast feed. Pregnancy history fully reviewed.  Patient reports no complaints.  Vitals:   01/19/21 0921  BP: 110/71  Pulse: 82  Weight: 116 lb (52.6 kg)    HISTORY: OB History  Gravida Para Term Preterm AB Living  4 2 2   1 2   SAB IAB Ectopic Multiple Live Births  1     0 2    # Outcome Date GA Lbr Len/2nd Weight Sex Delivery Anes PTL Lv  4 Current           3 SAB 08/2020          2 Term 12/15/19 [redacted]w[redacted]d 08:55 / 00:35 6 lb 6.5 oz (2.906 kg) F Vag-Spont EPI  LIV     Birth Comments: [phone]  1 Term 09/23/09   6 lb (2.722 kg) F Vag-Spont EPI N LIV   Past Medical History:  Diagnosis Date   Medical history non-contributory    Past Surgical History:  Procedure Laterality Date   NO PAST SURGERIES     No family history on file.   Exam    Uterus:   13 week  Pelvic Exam:    Perineum: deferred   Vulva:    Vagina:     pH:    Cervix:    Adnexa:    Bony Pelvis:   System: Breast:  normal appearance, no masses or tenderness   Skin: normal coloration and turgor, no rashes    Neurologic: oriented, normal mood   Extremities: normal strength, tone, and muscle mass   HEENT PERRLA, extra ocular movement intact, thyroid without masses, and trachea midline   Mouth/Teeth mucous membranes moist, pharynx normal without lesions and dental hygiene good   Neck supple   Cardiovascular: regular rate and rhythm   Respiratory:  appears well, vitals normal, no respiratory distress, acyanotic, normal RR, chest clear, no wheezing, crepitations, rhonchi, normal symmetric air entry   Abdomen: soft, non-tender; bowel sounds normal; no masses,  no organomegaly   Urinary:       Assessment:    Pregnancy: 09/25/09 Patient Active Problem List   Diagnosis Date Noted   AMA (advanced maternal  age) multigravida 35+ 01/19/2021   Supervision of high risk pregnancy, antepartum 01/05/2021   Missed ab 09/22/2020        Plan:     Initial labs drawn. Prenatal vitamins. Problem list reviewed and updated. Genetic Screening discussed : ordered.  Ultrasound discussed; fetal survey: ordered.  Follow up in 4 weeks. 50% of 30 min visit spent on counseling and coordination of care.  09/24/2020 dating by 10.6 week limited CRL    [place] 01/19/2021

## 2021-01-19 NOTE — Progress Notes (Signed)
Pt presents today for NOB. No additional complaints today.

## 2021-01-20 LAB — CBC/D/PLT+RPR+RH+ABO+RUBIGG...
Antibody Screen: NEGATIVE
Basophils Absolute: 0 10*3/uL (ref 0.0–0.2)
Basos: 0 %
EOS (ABSOLUTE): 1.7 10*3/uL — ABNORMAL HIGH (ref 0.0–0.4)
Eos: 16 %
HCV Ab: <0.1 s/co ratio (ref 0.0–0.9)
HIV Screen 4th Generation wRfx: NONREACTIVE
Hematocrit: 38 % (ref 34.0–46.6)
Hemoglobin: 12.4 g/dL (ref 11.1–15.9)
Hepatitis B Surface Ag: NEGATIVE
Immature Grans (Abs): 0 10*3/uL (ref 0.0–0.1)
Immature Granulocytes: 0 %
Lymphocytes Absolute: 2.6 10*3/uL (ref 0.7–3.1)
Lymphs: 24 %
MCH: 26.8 pg (ref 26.6–33.0)
MCHC: 32.6 g/dL (ref 31.5–35.7)
MCV: 82 fL (ref 79–97)
Monocytes Absolute: 0.5 10*3/uL (ref 0.1–0.9)
Monocytes: 5 %
Neutrophils Absolute: 6 10*3/uL (ref 1.4–7.0)
Neutrophils: 55 %
Platelets: 347 10*3/uL (ref 150–450)
RBC: 4.62 x10E6/uL (ref 3.77–5.28)
RDW: 16.3 % — ABNORMAL HIGH (ref 11.7–15.4)
RPR Ser Ql: NONREACTIVE
Rh Factor: POSITIVE
Rubella Antibodies, IGG: 9.21 index (ref >0.99)
WBC: 10.9 10*3/uL — ABNORMAL HIGH (ref 3.4–10.8)

## 2021-01-20 LAB — CERVICOVAGINAL ANCILLARY ONLY
Chlamydia: NEGATIVE
Comment: NEGATIVE
Comment: NEGATIVE
Comment: NORMAL
Neisseria Gonorrhea: NEGATIVE
Trichomonas: NEGATIVE

## 2021-01-20 LAB — HCV INTERPRETATION

## 2021-01-21 ENCOUNTER — Encounter: Payer: Medicaid Other | Admitting: Nurse Practitioner

## 2021-01-21 LAB — CULTURE, OB URINE

## 2021-01-21 LAB — URINE CULTURE, OB REFLEX: Organism ID, Bacteria: NO GROWTH

## 2021-01-25 ENCOUNTER — Encounter: Payer: Self-pay | Admitting: Obstetrics & Gynecology

## 2021-01-31 ENCOUNTER — Encounter: Payer: Self-pay | Admitting: Obstetrics & Gynecology

## 2021-02-05 IMAGING — US US MFM OB FOLLOW-UP
1 series · 13 of 28 positions shown · non-contrast
Comparison: none

[Series 1: us mfm ob follow-up · 13 of 49 slices shown]
[im 2/49]
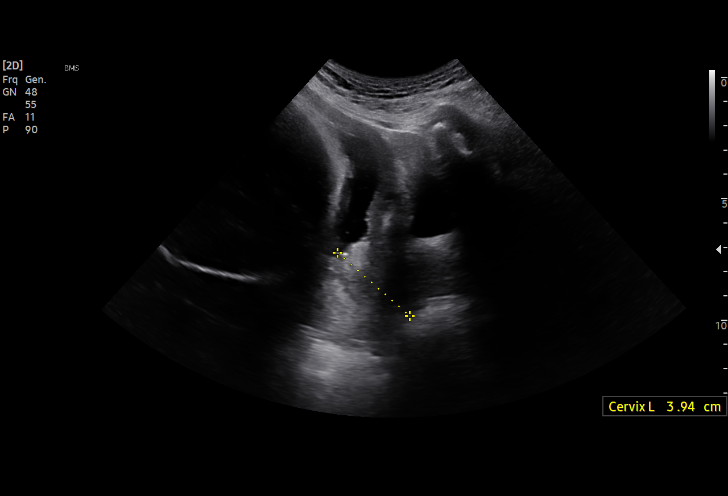
[im 6/49]
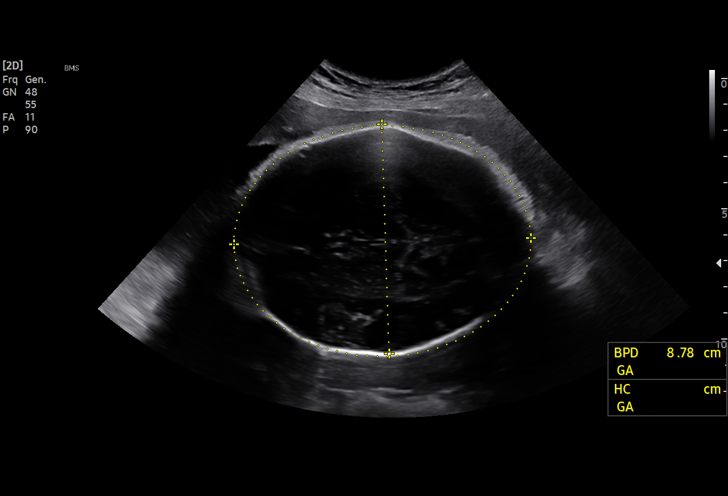
[im 9/49]
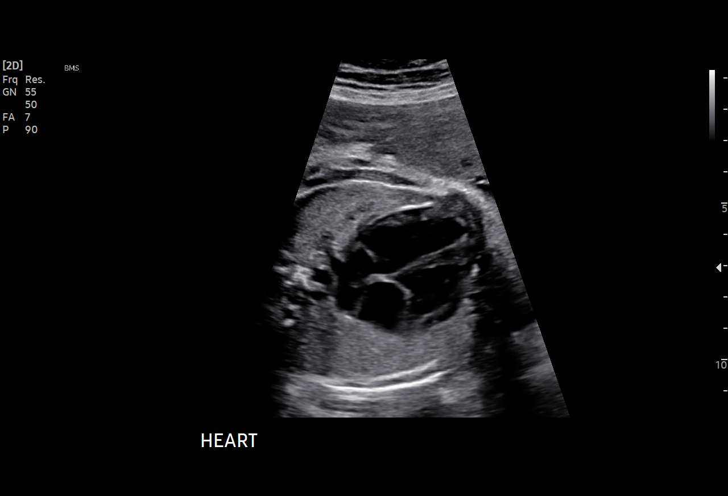
[im 13/49]
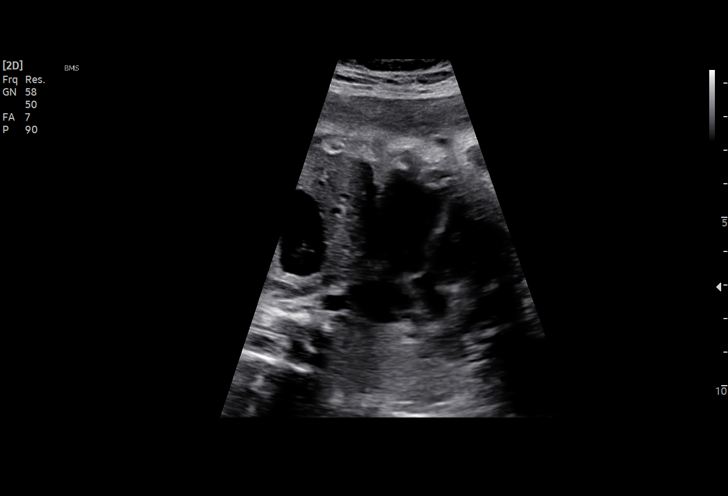
[im 17/49]
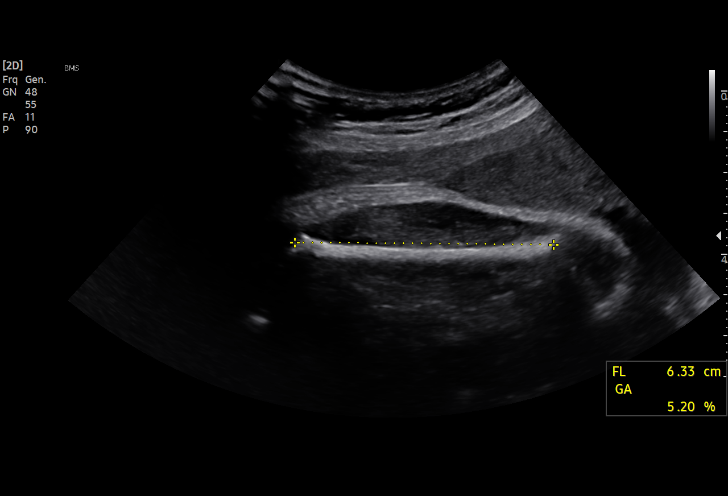
[im 20/49]
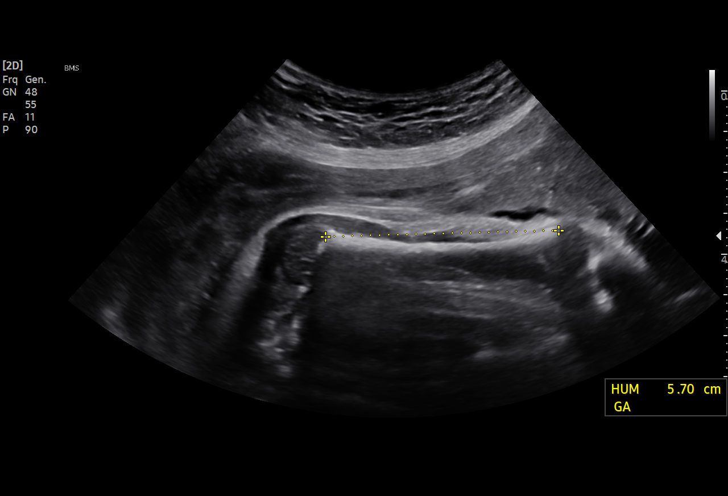
[im 25/49]
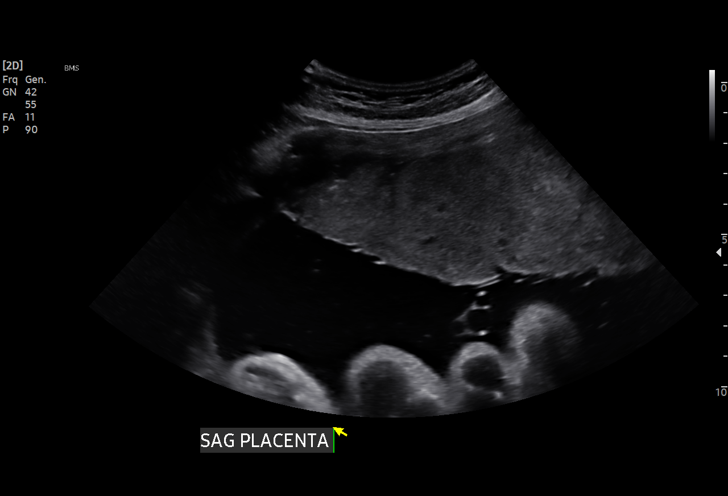
[im 29/49]
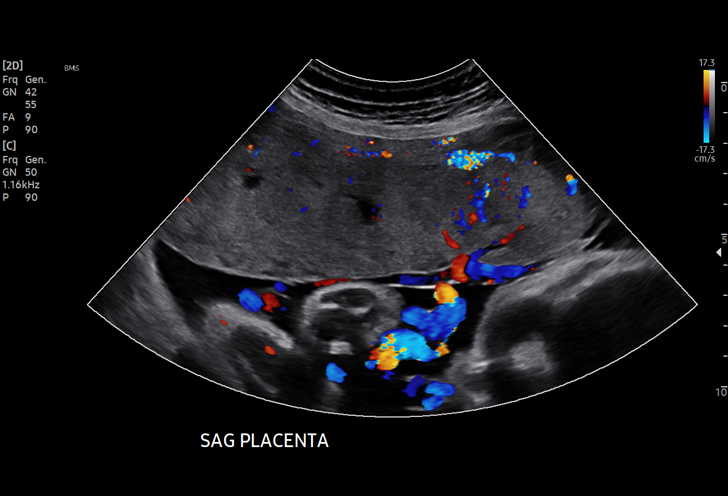
[im 33/49]
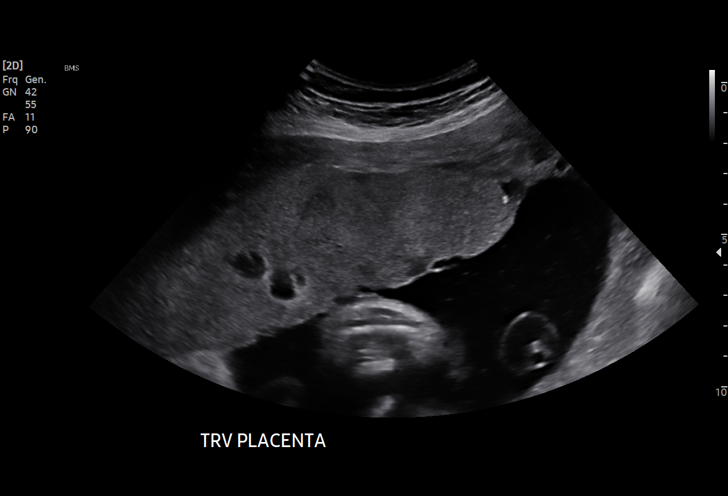
[im 36/49]
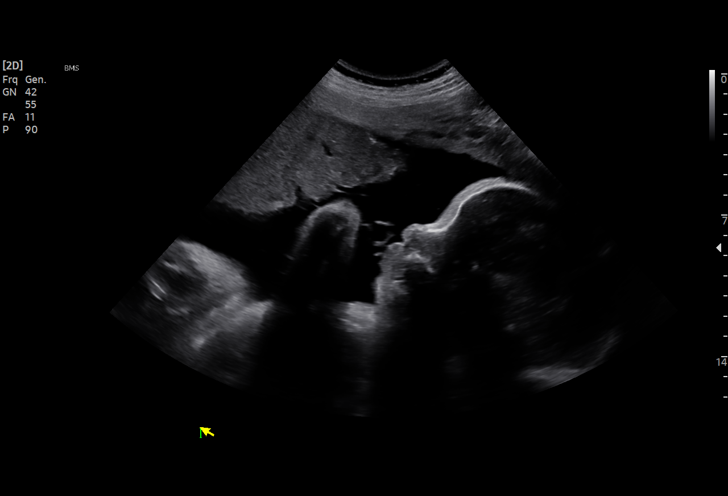
[im 40/49]
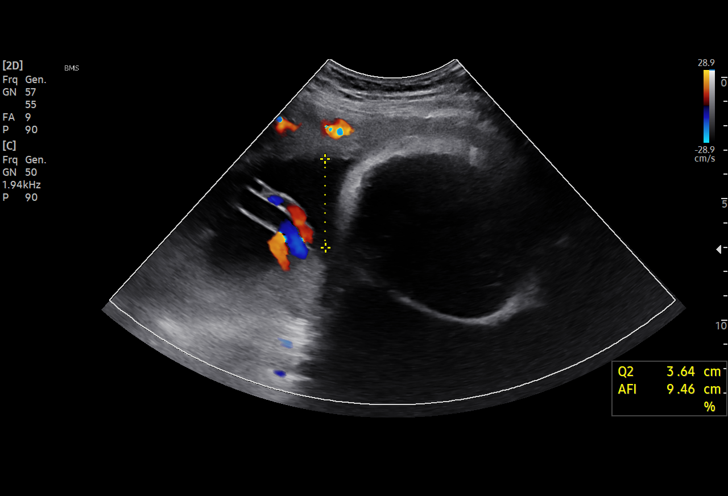
[im 43/49]
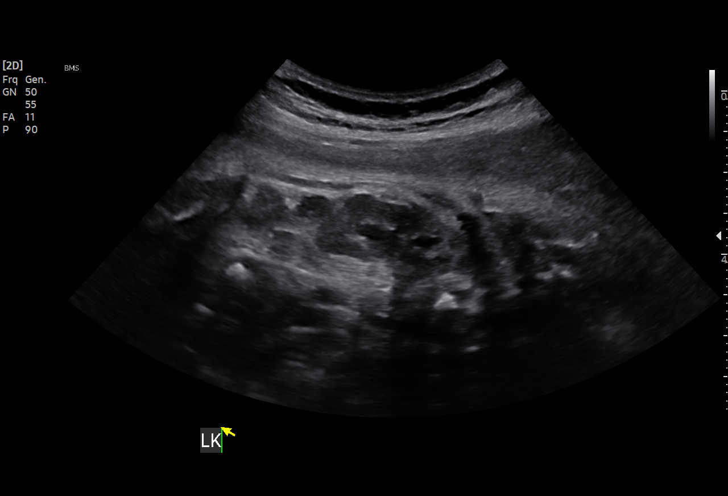
[im 47/49]
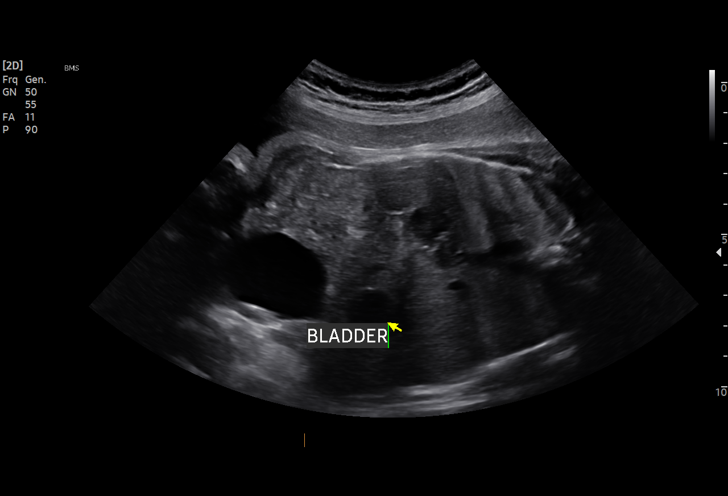

[13 of 28 positions shown; findings below may reference images not displayed]

Indications

 Uterine size-date discrepancy, third trimester
 34 weeks gestation of pregnancy
 Advanced maternal age multigravida 35+,
 third trimester (38) (neg W1t2Z)
Fetal Evaluation

 Num Of Fetuses:         1
 Fetal Heart Rate(bpm):  129
 Cardiac Activity:       Observed
 Presentation:           Cephalic
 Placenta:               Anterior
 P. Cord Insertion:      Visualized, central

 Amniotic Fluid
 AFI FV:      Within normal limits

 AFI Sum(cm)     %Tile       Largest Pocket(cm)
 18.9            70

 RUQ(cm)       RLQ(cm)       LUQ(cm)        LLQ(cm)

Biometry

 BPD:      87.8  mm     G. Age:  35w 3d         72  %    CI:        77.81   %    70 - 86
                                                         FL/HC:      20.2   %    20.1 -
 HC:       315   mm     G. Age:  35w 2d         30  %    HC/AC:      1.02        0.93 -
 AC:      308.5  mm     G. Age:  34w 6d         58  %    FL/BPD:     72.3   %    71 - 87
 FL:       63.5  mm     G. Age:  32w 6d          6  %    FL/AC:      20.6   %    20 - 24
 HUM:      56.6  mm     G. Age:  32w 6d         26  %

 Est. FW:    9691  gm      5 lb 5 oz     36  %
OB History

 Gravidity:    2         Term:   1        Prem:   0        SAB:   0
 TOP:          0       Ectopic:  0        Living: 0
Gestational Age

 LMP:           34w 5d        Date:  03/20/19                 EDD:   12/25/19
 U/S Today:     34w 4d                                        EDD:   12/26/19
 Best:          34w 5d     Det. By:  LMP  (03/20/19)          EDD:   12/25/19
Anatomy

 Cranium:               Appears normal         LVOT:                   Appears normal
 Cavum:                 Appears normal         Aortic Arch:            Previously seen
 Ventricles:            Previously seen        Ductal Arch:            Previously seen
 Choroid Plexus:        Previously seen        Diaphragm:              Appears normal
 Cerebellum:            Previously seen        Stomach:                Appears normal, left
                                                                       sided
 Posterior Fossa:       Previously seen        Abdomen:                Appears normal
 Nuchal Fold:           Previously seen        Abdominal Wall:         Previously seen
 Face:                  Appears normal         Cord Vessels:           Not well visualized
                        (orbits and profile)
 Lips:                  Appears normal         Kidneys:                Appear normal
 Palate:                Previously seen        Bladder:                Appears normal
 Thoracic:              Appears normal         Spine:                  Previously seen
 Heart:                 Appears normal         Upper Extremities:      Previously seen
                        (4CH, axis, and
                        situs)
 RVOT:                  Appears normal         Lower Extremities:      Previously seen

 Other:  Female gender Open hands visualized. Heels and 5th digit previously
         seen. Nasal bone visualized.
Cervix Uterus Adnexa

 Cervix
 Length:           3.94  cm.
 Normal appearance by transabdominal scan.

 Uterus
 No abnormality visualized.

 Right Ovary
 Not visualized.

 Left Ovary
 Not visualized.

 Adnexa
 No abnormality visualized.
Comments

 This patient was seen for a follow up growth scan as her
 fundal heights have been measuring less than her gestational
 age.  She denies any problems in her current pregnancy and
 reports that her blood pressures have been within normal
 limits.  She has screened negative for gestational diabetes.
 She was informed that the fetal growth and amniotic fluid
 level appears appropriate for her gestational age.
 As the fetal growth is within normal limits, no further exams
 were scheduled in our office.

## 2021-02-08 DIAGNOSIS — Z419 Encounter for procedure for purposes other than remedying health state, unspecified: Secondary | ICD-10-CM | POA: Diagnosis not present

## 2021-02-21 ENCOUNTER — Ambulatory Visit (INDEPENDENT_AMBULATORY_CARE_PROVIDER_SITE_OTHER): Payer: Medicaid Other | Admitting: Obstetrics and Gynecology

## 2021-02-21 ENCOUNTER — Encounter: Payer: Self-pay | Admitting: Obstetrics and Gynecology

## 2021-02-21 ENCOUNTER — Other Ambulatory Visit: Payer: Self-pay

## 2021-02-21 VITALS — BP 125/73 | HR 93 | Wt 120.0 lb

## 2021-02-21 DIAGNOSIS — Z23 Encounter for immunization: Secondary | ICD-10-CM

## 2021-02-21 DIAGNOSIS — O09522 Supervision of elderly multigravida, second trimester: Secondary | ICD-10-CM

## 2021-02-21 DIAGNOSIS — O0990 Supervision of high risk pregnancy, unspecified, unspecified trimester: Secondary | ICD-10-CM | POA: Diagnosis not present

## 2021-02-21 MED ORDER — ASPIRIN EC 81 MG PO TBEC: 81.0000 mg | DELAYED_RELEASE_TABLET | Freq: Every day | ORAL | 2 refills | Status: DC

## 2021-02-21 NOTE — Progress Notes (Signed)
   PRENATAL VISIT NOTE  Subjective:  Jessica Ewing is a 40 y.o. Y4M2500 at [redacted]w[redacted]d being seen today for ongoing prenatal care.  She is currently monitored for the following issues for this high-risk pregnancy and has Missed ab; Supervision of high risk pregnancy, antepartum; and AMA (advanced maternal age) multigravida 35+ on their problem list.  Patient reports no complaints.  Contractions: Not present. Vag. Bleeding: None.   . Denies leaking of fluid.   The following portions of the patient's history were reviewed and updated as appropriate: allergies, current medications, past family history, past medical history, past social history, past surgical history and problem list.   Objective:   Vitals:   02/21/21 0907  BP: 125/73  Pulse: 93  Weight: 120 lb (54.4 kg)    Fetal Status: Fetal Heart Rate (bpm): 152         General:  Alert, oriented and cooperative. Patient is in no acute distress.  Skin: Skin is warm and dry. No rash noted.   Cardiovascular: Normal heart rate noted  Respiratory: Normal respiratory effort, no problems with respiration noted  Abdomen: Soft, gravid, appropriate for gestational age.  Pain/Pressure: Absent     Pelvic: Cervical exam deferred        Extremities: Normal range of motion.  Edema: Trace  Mental Status: Normal mood and affect. Normal behavior. Normal judgment and thought content.   Assessment and Plan:  Pregnancy: [id number] at [redacted]w[redacted]d 1. Supervision of high risk pregnancy, antepartum Patient is doing well without complaints AFP and flu vaccine today Follow up anatomy ultrasound  2. Multigravida of advanced maternal age in second trimester Rx ASA provided  Preterm labor symptoms and general obstetric precautions including but not limited to vaginal bleeding, contractions, leaking of fluid and fetal movement were reviewed in detail with the patient. Please refer to After Visit Summary for other counseling recommendations.   Return in about 4 weeks  (around 03/21/2021) for in person, ROB, High risk.  Future Appointments  Date Time Provider Department Center  03/07/2021  8:15 AM [facility] NURSE [facility]  03/07/2021  8:30 AM [facility] US3 [facility]    Catalina Antigua, MD

## 2021-02-21 NOTE — Progress Notes (Signed)
ROB/AFP.  FLU vaccine given in RD, tolerated well. °

## 2021-02-23 LAB — AFP, SERUM, OPEN SPINA BIFIDA
AFP MoM: 0.94
AFP Value: 46.1 ng/mL
Gest. Age on Collection Date: 17.4 weeks
Maternal Age At EDD: 40.5 yr
OSBR Risk 1 IN: 10000
Test Results:: NEGATIVE
Weight: 120 [lb_av]

## 2021-03-07 ENCOUNTER — Ambulatory Visit: Payer: Medicaid Other | Admitting: *Deleted

## 2021-03-07 ENCOUNTER — Other Ambulatory Visit: Payer: Self-pay | Admitting: *Deleted

## 2021-03-07 ENCOUNTER — Other Ambulatory Visit: Payer: Self-pay

## 2021-03-07 ENCOUNTER — Ambulatory Visit: Payer: Medicaid Other | Attending: Obstetrics & Gynecology

## 2021-03-07 ENCOUNTER — Encounter: Payer: Self-pay | Admitting: *Deleted

## 2021-03-07 VITALS — BP 112/55 | HR 79

## 2021-03-07 DIAGNOSIS — O09522 Supervision of elderly multigravida, second trimester: Secondary | ICD-10-CM

## 2021-03-07 DIAGNOSIS — O0990 Supervision of high risk pregnancy, unspecified, unspecified trimester: Secondary | ICD-10-CM

## 2021-03-10 DIAGNOSIS — Z419 Encounter for procedure for purposes other than remedying health state, unspecified: Secondary | ICD-10-CM | POA: Diagnosis not present

## 2021-03-25 ENCOUNTER — Encounter: Payer: Self-pay | Admitting: Obstetrics & Gynecology

## 2021-03-25 ENCOUNTER — Ambulatory Visit (INDEPENDENT_AMBULATORY_CARE_PROVIDER_SITE_OTHER): Payer: Medicaid Other | Admitting: Obstetrics & Gynecology

## 2021-03-25 ENCOUNTER — Other Ambulatory Visit: Payer: Self-pay

## 2021-03-25 VITALS — BP 110/69 | HR 83 | Wt 122.0 lb

## 2021-03-25 DIAGNOSIS — O09522 Supervision of elderly multigravida, second trimester: Secondary | ICD-10-CM

## 2021-03-25 DIAGNOSIS — O4402 Placenta previa specified as without hemorrhage, second trimester: Secondary | ICD-10-CM

## 2021-03-25 DIAGNOSIS — O0990 Supervision of high risk pregnancy, unspecified, unspecified trimester: Secondary | ICD-10-CM

## 2021-03-25 NOTE — Progress Notes (Signed)
° °  PRENATAL VISIT NOTE  Subjective:  Jessica Ewing is a 40 y.o. D3T7017 at [redacted]w[redacted]d being seen today for ongoing prenatal care.  She is currently monitored for the following issues for this high-risk pregnancy and has Missed ab; Supervision of high risk pregnancy, antepartum; and AMA (advanced maternal age) multigravida 35+ on their problem list.  Patient reports no complaints.  Contractions: Not present. Vag. Bleeding: None.  Movement: Present. Denies leaking of fluid.   The following portions of the patient's history were reviewed and updated as appropriate: allergies, current medications, past family history, past medical history, past social history, past surgical history and problem list.   Objective:   Vitals:   03/25/21 0842  BP: 110/69  Pulse: 83  Weight: 122 lb (55.3 kg)    Fetal Status: Fetal Heart Rate (bpm): 140  Fundal Height: 22 cm Movement: Present     General:  Alert, oriented and cooperative. Patient is in no acute distress.  Skin: Skin is warm and dry. No rash noted.   Cardiovascular: Normal heart rate noted  Respiratory: Normal respiratory effort, no problems with respiration noted  Abdomen: Soft, gravid, appropriate for gestational age.  Pain/Pressure: Absent     Pelvic: Cervical exam deferred        Extremities: Normal range of motion.  Edema: None  Mental Status: Normal mood and affect. Normal behavior. Normal judgment and thought content.   Assessment and Plan:  Pregnancy: B9T9030 at [redacted]w[redacted]d 1. Multigravida of advanced maternal age in second trimester F/u US   2. Supervision of high risk pregnancy, antepartum Partial placenta previa  Preterm labor symptoms and general obstetric precautions including but not limited to vaginal bleeding, contractions, leaking of fluid and fetal movement were reviewed in detail with the patient. Please refer to After Visit Summary for other counseling recommendations.   Return in about 4 weeks (around 04/22/2021).  Future  Appointments  Date Time Provider Department Center  04/05/2021  1:00 PM [facility] NURSE [facility]  04/05/2021  1:15 PM [facility] US2 [facility]    Scheryl Darter, MD

## 2021-03-25 NOTE — Progress Notes (Signed)
Pt presents for ROB without complaints today.  

## 2021-04-05 ENCOUNTER — Other Ambulatory Visit: Payer: Self-pay | Admitting: *Deleted

## 2021-04-05 ENCOUNTER — Encounter: Payer: Self-pay | Admitting: *Deleted

## 2021-04-05 ENCOUNTER — Ambulatory Visit: Payer: Medicaid Other | Admitting: *Deleted

## 2021-04-05 ENCOUNTER — Other Ambulatory Visit: Payer: Self-pay

## 2021-04-05 ENCOUNTER — Ambulatory Visit: Payer: Medicaid Other | Attending: Obstetrics

## 2021-04-05 VITALS — BP 115/61 | HR 91

## 2021-04-05 DIAGNOSIS — O09522 Supervision of elderly multigravida, second trimester: Secondary | ICD-10-CM | POA: Diagnosis not present

## 2021-04-05 DIAGNOSIS — O0990 Supervision of high risk pregnancy, unspecified, unspecified trimester: Secondary | ICD-10-CM | POA: Insufficient documentation

## 2021-04-05 DIAGNOSIS — O09523 Supervision of elderly multigravida, third trimester: Secondary | ICD-10-CM

## 2021-04-05 DIAGNOSIS — O283 Abnormal ultrasonic finding on antenatal screening of mother: Secondary | ICD-10-CM

## 2021-04-05 DIAGNOSIS — O4443 Low lying placenta NOS or without hemorrhage, third trimester: Secondary | ICD-10-CM

## 2021-04-10 DIAGNOSIS — Z419 Encounter for procedure for purposes other than remedying health state, unspecified: Secondary | ICD-10-CM | POA: Diagnosis not present

## 2021-04-22 ENCOUNTER — Other Ambulatory Visit: Payer: Medicaid Other

## 2021-04-22 ENCOUNTER — Encounter: Payer: Medicaid Other | Admitting: Obstetrics and Gynecology

## 2021-04-25 ENCOUNTER — Encounter: Payer: Self-pay | Admitting: Advanced Practice Midwife

## 2021-04-25 ENCOUNTER — Ambulatory Visit (INDEPENDENT_AMBULATORY_CARE_PROVIDER_SITE_OTHER): Payer: Medicaid Other | Admitting: Advanced Practice Midwife

## 2021-04-25 ENCOUNTER — Other Ambulatory Visit: Payer: Self-pay

## 2021-04-25 ENCOUNTER — Other Ambulatory Visit: Payer: Medicaid Other

## 2021-04-25 VITALS — BP 114/72 | HR 79 | Wt 126.0 lb

## 2021-04-25 DIAGNOSIS — O4402 Placenta previa specified as without hemorrhage, second trimester: Secondary | ICD-10-CM

## 2021-04-25 DIAGNOSIS — O09529 Supervision of elderly multigravida, unspecified trimester: Secondary | ICD-10-CM

## 2021-04-25 DIAGNOSIS — O0990 Supervision of high risk pregnancy, unspecified, unspecified trimester: Secondary | ICD-10-CM | POA: Diagnosis not present

## 2021-04-25 DIAGNOSIS — Z23 Encounter for immunization: Secondary | ICD-10-CM

## 2021-04-25 DIAGNOSIS — O26899 Other specified pregnancy related conditions, unspecified trimester: Secondary | ICD-10-CM

## 2021-04-25 DIAGNOSIS — R102 Pelvic and perineal pain: Secondary | ICD-10-CM

## 2021-04-25 NOTE — Progress Notes (Signed)
Pt presents for ROB and Tdap. GAD7=0 PHQ9=4 - pt declines referral for counseling services  Tdap given today LD without difficulty

## 2021-04-25 NOTE — Progress Notes (Signed)
° °  PRENATAL VISIT NOTE  Subjective:  Jessica Ewing is a 41 y.o. A0T6226 at [redacted]w[redacted]d being seen today for ongoing prenatal care.  She is currently monitored for the following issues for this low-risk pregnancy and has Missed ab; Supervision of high risk pregnancy, antepartum; AMA (advanced maternal age) multigravida 35+; and Placenta previa antepartum, second trimester on their problem list.  Patient reports  right lower abdomen/groin pain .  Contractions: Not present. Vag. Bleeding: None.  Movement: Present. Denies leaking of fluid.   The following portions of the patient's history were reviewed and updated as appropriate: allergies, current medications, past family history, past medical history, past social history, past surgical history and problem list.   Objective:   Vitals:   04/25/21 1059  BP: 114/72  Pulse: 79  Weight: 126 lb (57.2 kg)    Fetal Status:     Movement: Present     General:  Alert, oriented and cooperative. Patient is in no acute distress.  Skin: Skin is warm and dry. No rash noted.   Cardiovascular: Normal heart rate noted  Respiratory: Normal respiratory effort, no problems with respiration noted  Abdomen: Soft, gravid, appropriate for gestational age.  Pain/Pressure: Absent     Pelvic: Cervical exam deferred        Extremities: Normal range of motion.  Edema: None  Mental Status: Normal mood and affect. Normal behavior. Normal judgment and thought content.   Assessment and Plan:  Pregnancy: J3H5456 at [redacted]w[redacted]d 1. Supervision of high risk pregnancy, antepartum --Anticipatory guidance about next visits/weeks of pregnancy given. --Next visit in 4 weeks  - Tdap vaccine greater than or equal to 7yo IM - Glucose Tolerance, 2 Hours w/1 Hour - HIV Antibody (routine testing w rflx) - RPR - CBC  2. Placenta previa antepartum, second trimester --Discussed recent US with low lying placenta. No bleeding or cramping. --Will likely resolve before delivery.  F/U US with  MFM on 05/13/21.  3. Antepartum multigravida of advanced maternal age   51. Pain of round ligament affecting pregnancy, antepartum --Rest/ice/heat/warm bath/increase PO fluids/Tylenol/pregnancy support belt   Preterm labor symptoms and general obstetric precautions including but not limited to vaginal bleeding, contractions, leaking of fluid and fetal movement were reviewed in detail with the patient. Please refer to After Visit Summary for other counseling recommendations.   No follow-ups on file.  Future Appointments  Date Time Provider Department Center  05/13/2021  1:30 PM [facility] NURSE [facility]  05/13/2021  1:45 PM [facility] US6 [facility]    Sharen Counter, CNM

## 2021-04-26 LAB — GLUCOSE TOLERANCE, 2 HOURS W/ 1HR
Glucose, 1 hour: 150 mg/dL (ref 70–179)
Glucose, 2 hour: 140 mg/dL (ref 70–152)
Glucose, Fasting: 73 mg/dL (ref 70–91)

## 2021-04-26 LAB — RPR: RPR Ser Ql: NONREACTIVE

## 2021-04-26 LAB — CBC
Hematocrit: 33.7 % — ABNORMAL LOW (ref 34.0–46.6)
Hemoglobin: 11.1 g/dL (ref 11.1–15.9)
MCH: 27.5 pg (ref 26.6–33.0)
MCHC: 32.9 g/dL (ref 31.5–35.7)
MCV: 84 fL (ref 79–97)
Platelets: 329 10*3/uL (ref 150–450)
RBC: 4.03 x10E6/uL (ref 3.77–5.28)
RDW: 13.3 % (ref 11.7–15.4)
WBC: 12.3 10*3/uL — ABNORMAL HIGH (ref 3.4–10.8)

## 2021-04-26 LAB — HIV ANTIBODY (ROUTINE TESTING W REFLEX): HIV Screen 4th Generation wRfx: NONREACTIVE

## 2021-05-11 DIAGNOSIS — Z419 Encounter for procedure for purposes other than remedying health state, unspecified: Secondary | ICD-10-CM | POA: Diagnosis not present

## 2021-05-13 ENCOUNTER — Other Ambulatory Visit: Payer: Self-pay

## 2021-05-13 ENCOUNTER — Ambulatory Visit: Payer: Medicaid Other | Attending: Maternal & Fetal Medicine

## 2021-05-13 ENCOUNTER — Ambulatory Visit: Payer: Medicaid Other | Admitting: *Deleted

## 2021-05-13 VITALS — BP 119/64 | HR 91

## 2021-05-13 DIAGNOSIS — O0990 Supervision of high risk pregnancy, unspecified, unspecified trimester: Secondary | ICD-10-CM

## 2021-05-13 DIAGNOSIS — O283 Abnormal ultrasonic finding on antenatal screening of mother: Secondary | ICD-10-CM | POA: Insufficient documentation

## 2021-05-13 DIAGNOSIS — O09523 Supervision of elderly multigravida, third trimester: Secondary | ICD-10-CM | POA: Diagnosis not present

## 2021-05-13 DIAGNOSIS — Z3A29 29 weeks gestation of pregnancy: Secondary | ICD-10-CM

## 2021-05-13 DIAGNOSIS — O4443 Low lying placenta NOS or without hemorrhage, third trimester: Secondary | ICD-10-CM | POA: Insufficient documentation

## 2021-05-16 ENCOUNTER — Other Ambulatory Visit: Payer: Self-pay | Admitting: *Deleted

## 2021-05-16 DIAGNOSIS — O09523 Supervision of elderly multigravida, third trimester: Secondary | ICD-10-CM

## 2021-05-25 ENCOUNTER — Ambulatory Visit (INDEPENDENT_AMBULATORY_CARE_PROVIDER_SITE_OTHER): Payer: Medicaid Other | Admitting: Obstetrics

## 2021-05-25 ENCOUNTER — Other Ambulatory Visit: Payer: Self-pay

## 2021-05-25 ENCOUNTER — Encounter: Payer: Self-pay | Admitting: Obstetrics

## 2021-05-25 VITALS — BP 101/65 | HR 91 | Wt 133.3 lb

## 2021-05-25 DIAGNOSIS — O4403 Placenta previa specified as without hemorrhage, third trimester: Secondary | ICD-10-CM

## 2021-05-25 DIAGNOSIS — O0990 Supervision of high risk pregnancy, unspecified, unspecified trimester: Secondary | ICD-10-CM

## 2021-05-25 DIAGNOSIS — O09529 Supervision of elderly multigravida, unspecified trimester: Secondary | ICD-10-CM

## 2021-05-25 NOTE — Progress Notes (Addendum)
Subjective:  Jessica Ewing is a 41 y.o. [id number] at [redacted]w[redacted]d being seen today for ongoing prenatal care.  She is currently monitored for the following issues for this high-risk pregnancy and has Missed ab; Supervision of high risk pregnancy, antepartum; AMA (advanced maternal age) multigravida 35+; and Placenta previa antepartum, second trimester on their problem list.  Patient reports no complaints.  Contractions: Not present. Vag. Bleeding: None.  Movement: Present. Denies leaking of fluid.   The following portions of the patient's history were reviewed and updated as appropriate: allergies, current medications, past family history, past medical history, past social history, past surgical history and problem list. Problem list updated.  Objective:   Vitals:   05/25/21 0949  Weight: 133 lb 4.8 oz (60.5 kg)    Fetal Status:     Movement: Present     General:  Alert, oriented and cooperative. Patient is in no acute distress.  Skin: Skin is warm and dry. No rash noted.   Cardiovascular: Normal heart rate noted  Respiratory: Normal respiratory effort, no problems with respiration noted  Abdomen: Soft, gravid, appropriate for gestational age. Pain/Pressure: Present     Pelvic:  Cervical exam deferred        Extremities: Normal range of motion.  Edema: None  Mental Status: Normal mood and affect. Normal behavior. Normal judgment and thought content.   Urinalysis:        [place] MFM OB FOLLOW UP (Accession [id number]) (Order [id number]) Imaging Date: 05/13/2021 Department: [facility] Released By: Claudius Sis Authorizing: Jaynie Collins, MD   Exam Status  Status  Final [99]   PACS Intelerad Image Link   Show images for [place] MFM OB FOLLOW UP Study Result  Narrative & Impression  ----------------------------------------------------------------------  OBSTETRICS REPORT                       (Signed Final 05/13/2021 02:48  pm) ---------------------------------------------------------------------- Patient Info  ID #:       [id number]                          D.O.B.:  1980/07/07 (40 yrs)  Name:       Jessica Ewing                    Visit Date: 05/13/2021 01:43 pm ---------------------------------------------------------------------- Performed By  Attending:        Johnell Comings MD         Secondary Phy.:   [facility]  Performed By:     Georgie Chard        Address:          [address]  [place]  Referred By:      Woodroe Mode         [facility]                                                             [facility]                                                             [facility]  Ref. Address:     [place], [place] ---------------------------------------------------------------------- Orders  #  Description                           Code        Ordered By  1  [place] MFM OB FOLLOW UP                   [id number]    Sander Nephew ----------------------------------------------------------------------  #  Order #                     Accession #                Episode #  1  [id number]  [id number]                 [id number] ---------------------------------------------------------------------- Indications  Advanced maternal age multigravida 76+,        O25.522  second trimester (40)  Placenta previa specified as without           O44.02  hemorrhage, second trimester  [redacted]  weeks gestation of pregnancy                Z3A.29  Encounter for other antenatal screening        Z36.2  follow-up  LR NIPS/Negative AFP/Negative Horizon ---------------------------------------------------------------------- Fetal Evaluation  Num Of Fetuses:         1  Fetal Heart Rate(bpm):  143  Cardiac Activity:       Observed  Presentation:           Cephalic  Placenta:               Posterior Previa  P. Cord Insertion:      Previously Visualized  Amniotic Fluid  AFI FV:      Within normal limits  AFI Sum(cm)     %Tile       Largest Pocket(cm)  16.93           63          6.27  RUQ(cm)       RLQ(cm)       LUQ(cm)        LLQ(cm)  4.62          3.81          2.23           6.27 ---------------------------------------------------------------------- Biometry  BPD:      68.3  mm     G. Age:  27w 3d        3.9  %    CI:        66.45   %    70 - 86                                                          FL/HC:      20.6   %    19.6 - 20.8  HC:      268.6  mm     G. Age:  29w 2d         21  %    HC/AC:      1.09        0.99 - 1.21  AC:      246.2  mm     G. Age:  28w 6d         35  %    FL/BPD:     81.0   %    71 - 87  FL:       55.3  mm     G. Age:  29w 1d         35  %    FL/AC:      22.5   %    20 - 24  LV:        2.5  mm  Est. FW:    1302  gm    2 lb 14 oz      28  % ---------------------------------------------------------------------- OB History  Blood Type:   O+  Gravidity:    4         Term:   2         SAB:   1  Living:       2 ---------------------------------------------------------------------- Gestational Age  LMP:           27w 4d        Date:  11/01/20                 EDD:   08/08/21  U/S Today:     28w 5d                                        EDD:   07/31/21  Best:          29w 1d     Det. By:  U/S C R L  (01/05/21)    EDD:   07/28/21 ---------------------------------------------------------------------- Anatomy  Cranium:               Appears normal          Aortic Arch:            Appears normal  Cavum:                 Previously seen        Ductal Arch:            Appears normal  Ventricles:            Appears normal         Diaphragm:              Appears normal  Choroid Plexus:        Previously seen        Stomach:                Appears normal, left                                                                        sided  Cerebellum:            Previously seen        Abdomen:                Previously seen  Posterior Fossa:       Previously seen        Abdominal Wall:         Previously seen  Nuchal Fold:           Not applicable ([id number]    Cord Vessels:           Previously seen                         wks GA)  Face:                  Orbits and profile     Kidneys:                Appear normal  previously seen  Lips:                  Previously seen        Bladder:                Appears normal  Thoracic:              Appears normal         Spine:                  Previously seen  Heart:                 Appears normal; EIF    Upper Extremities:      Previously seen  RVOT:                  Appears normal         Lower Extremities:      Previously seen  LVOT:                  Appears normal  Other:  Female gender previously seen. Nasal bone, Lenses, VC, Heels/feet          and open hands/5th digits visualized previously. ---------------------------------------------------------------------- Cervix Uterus Adnexa  Cervix  Length:           2.78  cm.  Normal appearance by transabdominal scan. ---------------------------------------------------------------------- Comments  This patient was seen for a follow up growth scan due to  advanced maternal age and a low-lying placenta/placenta  previa noted on her prior exams.  She denies any problems  since her last exam.  She was informed that the fetal growth and amniotic fluid  level appears appropriate for her gestational age.  A placenta previa continues to be noted  on today's exam.  The patient was reassured that most cases of placenta previa  will resolve later in her pregnancy.  A follow up exam was scheduled in 5 weeks.  We will perform  a transvaginal ultrasound at her next exam to assess the  placental location.  The patient understands that a cesarean delivery will be  recommended at around 37 weeks should a placenta previa  persist at her next exam. ----------------------------------------------------------------------                   Johnell Comings, MD Electronically Signed Final Report   05/13/2021 02:48 pm ----------------------------------------------------------------------   Assessment and Plan:  Pregnancy: [id number] at [redacted]w[redacted]d  1. Supervision of high risk pregnancy, antepartum  2. Placenta previa antepartum, third trimester - follow up ultrasound scheduled - discussed need for C/S at 37 weeks if placenta previa persists  3. Antepartum multigravida of advanced maternal age     There are no diagnoses linked to this encounter. Preterm labor symptoms and general obstetric precautions including but not limited to vaginal bleeding, contractions, leaking of fluid and fetal movement were reviewed in detail with the patient. Please refer to After Visit Summary for other counseling recommendations.   Return in about 2 weeks (around 06/08/2021) for [place].   Shelly Bombard, MD  05/25/21

## 2021-05-25 NOTE — Progress Notes (Signed)
ROB 30.[redacted] wks GA No complaints

## 2021-06-08 ENCOUNTER — Ambulatory Visit (INDEPENDENT_AMBULATORY_CARE_PROVIDER_SITE_OTHER): Payer: Medicaid Other | Admitting: Obstetrics

## 2021-06-08 ENCOUNTER — Encounter: Payer: Self-pay | Admitting: Obstetrics

## 2021-06-08 ENCOUNTER — Other Ambulatory Visit: Payer: Self-pay

## 2021-06-08 VITALS — BP 111/66 | HR 90 | Wt 137.0 lb

## 2021-06-08 DIAGNOSIS — O09529 Supervision of elderly multigravida, unspecified trimester: Secondary | ICD-10-CM

## 2021-06-08 DIAGNOSIS — Z419 Encounter for procedure for purposes other than remedying health state, unspecified: Secondary | ICD-10-CM | POA: Diagnosis not present

## 2021-06-08 DIAGNOSIS — O0990 Supervision of high risk pregnancy, unspecified, unspecified trimester: Secondary | ICD-10-CM

## 2021-06-08 DIAGNOSIS — O4403 Placenta previa specified as without hemorrhage, third trimester: Secondary | ICD-10-CM

## 2021-06-08 NOTE — Progress Notes (Signed)
Subjective:  ?Jessica Ewing is a 41 y.o. [id number] at [redacted]w[redacted]d being seen today for ongoing prenatal care.  She is currently monitored for the following issues for this high-risk pregnancy and has Missed ab; Supervision of high risk pregnancy, antepartum; AMA (advanced maternal age) multigravida 35+; and Placenta previa antepartum, second trimester on their problem list. ? ?Patient reports heartburn.  Contractions: Irritability. Vag. Bleeding: None.  Movement: Present. Denies leaking of fluid.  ? ?The following portions of the patient's history were reviewed and updated as appropriate: allergies, current medications, past family history, past medical history, past social history, past surgical history and problem list. Problem list updated. ? ?Objective:  ? ?Vitals:  ? 06/08/21 0853  ?BP: 111/66  ?Pulse: 90  ?Weight: 137 lb (62.1 kg)  ? ? ?Fetal Status: Fetal Heart Rate (bpm): 131   Movement: Present    ? ?General:  Alert, oriented and cooperative. Patient is in no acute distress.  ?Skin: Skin is warm and dry. No rash noted.   ?Cardiovascular: Normal heart rate noted  ?Respiratory: Normal respiratory effort, no problems with respiration noted  ?Abdomen: Soft, gravid, appropriate for gestational age. Pain/Pressure: Present     ?Pelvic:  Cervical exam deferred        ?Extremities: Normal range of motion.  Edema: None  ?Mental Status: Normal mood and affect. Normal behavior. Normal judgment and thought content.  ? ?Urinalysis:     ? ?Assessment and Plan:  ?Pregnancy: [id number] at [redacted]w[redacted]d ? ?1. Supervision of high risk pregnancy, antepartum ? ?2. Antepartum multigravida of advanced maternal age ? ?3. Placenta previa antepartum, third trimester ?- serial ultrasounds scheduled by MFM for placental location ?- discussed need for a C/S if previa remains ? ?Preterm labor symptoms and general obstetric precautions including but not limited to vaginal bleeding, contractions, leaking of fluid and fetal movement were reviewed in detail  with the patient. ?Please refer to After Visit Summary for other counseling recommendations.  ? ?Return in about 2 weeks (around 06/22/2021) for [facility]. ? ? ?Shelly Bombard, MD  ?06/08/21  ?

## 2021-06-08 NOTE — Progress Notes (Signed)
ROB reports no problems today. 

## 2021-06-17 ENCOUNTER — Other Ambulatory Visit: Payer: Self-pay

## 2021-06-17 ENCOUNTER — Other Ambulatory Visit: Payer: Self-pay | Admitting: *Deleted

## 2021-06-17 ENCOUNTER — Ambulatory Visit: Payer: Medicaid Other | Admitting: *Deleted

## 2021-06-17 ENCOUNTER — Ambulatory Visit: Payer: Medicaid Other | Attending: Obstetrics & Gynecology | Admitting: Obstetrics

## 2021-06-17 ENCOUNTER — Ambulatory Visit: Payer: Medicaid Other

## 2021-06-17 ENCOUNTER — Ambulatory Visit: Payer: Medicaid Other | Attending: Obstetrics

## 2021-06-17 VITALS — BP 122/70 | HR 99

## 2021-06-17 DIAGNOSIS — O4403 Placenta previa specified as without hemorrhage, third trimester: Secondary | ICD-10-CM

## 2021-06-17 DIAGNOSIS — O09523 Supervision of elderly multigravida, third trimester: Secondary | ICD-10-CM | POA: Diagnosis not present

## 2021-06-17 DIAGNOSIS — Z3A34 34 weeks gestation of pregnancy: Secondary | ICD-10-CM | POA: Diagnosis not present

## 2021-06-17 DIAGNOSIS — O0990 Supervision of high risk pregnancy, unspecified, unspecified trimester: Secondary | ICD-10-CM | POA: Insufficient documentation

## 2021-06-17 NOTE — Progress Notes (Signed)
MFM Note ? ?Jessica Ewing was seen for a follow up exam due to advanced maternal age (41 years old) and a low-lying placenta/placenta previa noted on her prior exams.  She denies any problems since her last exam. ? ?She was informed that the fetal growth and amniotic fluid level appears appropriate for her gestational age.  The overall EFW obtained today was 4 pounds 13 ounces (23rd percentile). ? ?A BPP performed today was 8 out of 8. ? ?On a transvaginal ultrasound performed today, the edge of the posterior placenta measured about 0.7 cm away from the internal cervical os.   ? ?As the patient has had 2 prior vaginal births, we will try to give her every opportunity to attempt another vaginal delivery.  We will perform another transvaginal ultrasound in 3 weeks (at 37 weeks) to determine how far the edge of the posterior placenta is from the internal cervical os.  If the edge of the placenta is 1.5 cm away from the internal cervical os or greater, a vaginal delivery can probably be attempted. ? ?As she is over the age of 76, we will continue to see her for weekly fetal testing until delivery.   ? ?She will return in 1 week for another BPP. ? ?The patient stated that all of her questions have been answered. ? ?A total of 20 minutes was spent counseling and coordinating the care for this patient.  Greater than 50% of the time was spent in direct face-to-face contact. ?

## 2021-06-22 ENCOUNTER — Encounter: Payer: Self-pay | Admitting: Obstetrics and Gynecology

## 2021-06-22 ENCOUNTER — Other Ambulatory Visit: Payer: Self-pay

## 2021-06-22 ENCOUNTER — Ambulatory Visit (INDEPENDENT_AMBULATORY_CARE_PROVIDER_SITE_OTHER): Payer: Medicaid Other | Admitting: Obstetrics and Gynecology

## 2021-06-22 VITALS — BP 107/69 | HR 76 | Wt 135.4 lb

## 2021-06-22 DIAGNOSIS — O09523 Supervision of elderly multigravida, third trimester: Secondary | ICD-10-CM

## 2021-06-22 DIAGNOSIS — O0990 Supervision of high risk pregnancy, unspecified, unspecified trimester: Secondary | ICD-10-CM

## 2021-06-22 DIAGNOSIS — Z3A34 34 weeks gestation of pregnancy: Secondary | ICD-10-CM

## 2021-06-22 DIAGNOSIS — O4443 Low lying placenta NOS or without hemorrhage, third trimester: Secondary | ICD-10-CM | POA: Insufficient documentation

## 2021-06-22 NOTE — Progress Notes (Signed)
? ?  PRENATAL VISIT NOTE ? ?Subjective:  ?Jessica Ewing is a 41 y.o. [id number] at [redacted]w[redacted]d being seen today for ongoing prenatal care.  She is currently monitored for the following issues for this high-risk pregnancy and has Missed ab; Supervision of high risk pregnancy, antepartum; AMA (advanced maternal age) multigravida 35+; Placenta previa antepartum, second trimester; and Low lying placenta nos or without hemorrhage, third trimester on their problem list. ? ?Patient doing well with no acute concerns today. She reports no complaints.  Contractions: Irritability. Vag. Bleeding: None.  Movement: Present. Denies leaking of fluid.  ? ?The following portions of the patient's history were reviewed and updated as appropriate: allergies, current medications, past family history, past medical history, past social history, past surgical history and problem list. Problem list updated. ? ?Objective:  ? ?Vitals:  ? 06/22/21 0855  ?BP: 107/69  ?Pulse: 76  ?Weight: 135 lb 6.4 oz (61.4 kg)  ? ? ?Fetal Status: Fetal Heart Rate (bpm): 135 Fundal Height: 35 cm Movement: Present    ? ?General:  Alert, oriented and cooperative. Patient is in no acute distress.  ?Skin: Skin is warm and dry. No rash noted.   ?Cardiovascular: Normal heart rate noted  ?Respiratory: Normal respiratory effort, no problems with respiration noted  ?Abdomen: Soft, gravid, appropriate for gestational age.  Pain/Pressure: Absent     ?Pelvic: Cervical exam deferred        ?Extremities: Normal range of motion.  Edema: None  ?Mental Status:  Normal mood and affect. Normal behavior. Normal judgment and thought content.  ? ?Assessment and Plan:  ?Pregnancy: [id number] at [redacted]w[redacted]d ? ?1. Supervision of high risk pregnancy, antepartum ?Continue routine care ?Strongly advise reviewing most recent pelvic ultrasound before performing cervical exam ? ?2. [redacted] weeks gestation of pregnancy ? ? ?3. Multigravida of advanced maternal age in third trimester ? ? ?4. Low lying placenta nos or  without hemorrhage, third trimester ?Pt has follow up TVUS on 06/22/21 ? ?Preterm labor symptoms and general obstetric precautions including but not limited to vaginal bleeding, contractions, leaking of fluid and fetal movement were reviewed in detail with the patient. ? ?Please refer to After Visit Summary for other counseling recommendations.  ? ?Return in about 1 week (around 06/29/2021) for [facility], in person, 36 weeks swabs. ? ? ?Lynnda Shields, MD ?Faculty Attending ?[facility] ?  ?

## 2021-06-24 ENCOUNTER — Ambulatory Visit: Payer: Medicaid Other | Attending: Obstetrics

## 2021-06-24 ENCOUNTER — Other Ambulatory Visit: Payer: Self-pay

## 2021-06-24 ENCOUNTER — Ambulatory Visit: Payer: Medicaid Other | Admitting: *Deleted

## 2021-06-24 ENCOUNTER — Encounter: Payer: Self-pay | Admitting: *Deleted

## 2021-06-24 VITALS — BP 107/59 | HR 90

## 2021-06-24 DIAGNOSIS — O09523 Supervision of elderly multigravida, third trimester: Secondary | ICD-10-CM | POA: Diagnosis not present

## 2021-06-24 DIAGNOSIS — Z3A35 35 weeks gestation of pregnancy: Secondary | ICD-10-CM | POA: Diagnosis not present

## 2021-06-24 DIAGNOSIS — O4403 Placenta previa specified as without hemorrhage, third trimester: Secondary | ICD-10-CM

## 2021-06-24 DIAGNOSIS — O0990 Supervision of high risk pregnancy, unspecified, unspecified trimester: Secondary | ICD-10-CM | POA: Diagnosis not present

## 2021-06-30 ENCOUNTER — Other Ambulatory Visit (HOSPITAL_COMMUNITY)
Admission: RE | Admit: 2021-06-30 | Discharge: 2021-06-30 | Disposition: A | Discharge status: Home / Self Care | Payer: Medicaid Other | Source: Ambulatory Visit | Attending: Obstetrics and Gynecology | Admitting: Obstetrics and Gynecology

## 2021-06-30 ENCOUNTER — Ambulatory Visit (INDEPENDENT_AMBULATORY_CARE_PROVIDER_SITE_OTHER): Payer: Medicaid Other | Admitting: Obstetrics and Gynecology

## 2021-06-30 ENCOUNTER — Other Ambulatory Visit: Payer: Self-pay

## 2021-06-30 VITALS — BP 134/72 | HR 101 | Wt 138.0 lb

## 2021-06-30 DIAGNOSIS — O0990 Supervision of high risk pregnancy, unspecified, unspecified trimester: Secondary | ICD-10-CM | POA: Diagnosis not present

## 2021-06-30 DIAGNOSIS — O09523 Supervision of elderly multigravida, third trimester: Secondary | ICD-10-CM

## 2021-06-30 DIAGNOSIS — O4443 Low lying placenta NOS or without hemorrhage, third trimester: Secondary | ICD-10-CM

## 2021-06-30 NOTE — Progress Notes (Signed)
ROB GBS 

## 2021-06-30 NOTE — Progress Notes (Signed)
? ?  PRENATAL VISIT NOTE ? ?Subjective:  ?Jessica Ewing is a 41 y.o. [id number] at [redacted]w[redacted]d being seen today for ongoing prenatal care.  She is currently monitored for the following issues for this high-risk pregnancy and has Supervision of high risk pregnancy, antepartum; AMA (advanced maternal age) multigravida 35+; and Low lying placenta nos or without hemorrhage, third trimester on their problem list. ? ?Patient reports no complaints.  Contractions: Irritability. Vag. Bleeding: None.  Movement: Present. Denies leaking of fluid.  ? ?The following portions of the patient's history were reviewed and updated as appropriate: allergies, current medications, past family history, past medical history, past social history, past surgical history and problem list.  ? ?Objective:  ? ?Vitals:  ? 06/30/21 1050  ?BP: 134/72  ?Pulse: (!) 101  ?Weight: 138 lb (62.6 kg)  ? ? ?Fetal Status: Fetal Heart Rate (bpm): 136   Movement: Present    ? ?General:  Alert, oriented and cooperative. Patient is in no acute distress.  ?Skin: Skin is warm and dry. No rash noted.   ?Cardiovascular: Normal heart rate noted  ?Respiratory: Normal respiratory effort, no problems with respiration noted  ?Abdomen: Soft, gravid, appropriate for gestational age.  Pain/Pressure: Absent     ?Pelvic: Cervical exam deferred        ?Extremities: Normal range of motion.  Edema: None  ?Mental Status: Normal mood and affect. Normal behavior. Normal judgment and thought content.  ? ?Assessment and Plan:  ?Pregnancy: [id number] at [redacted]w[redacted]d ?1. Supervision of high risk pregnancy, antepartum ?Depo provera ?- Strep Gp B NAA ?- Cervicovaginal ancillary only( [place]) ? ?2. Low lying placenta nos or without hemorrhage, third trimester ?Has rpt u/s on 3/29. ED precautions given. Request sent or 3/30 c-section. If no longer low lying, cancel c-section ? ?3. Multigravida of advanced maternal age in third trimester ?Has surveillance bpps continuing tomorrow ? ?Preterm labor symptoms  and general obstetric precautions including but not limited to vaginal bleeding, contractions, leaking of fluid and fetal movement were reviewed in detail with the patient. ?Please refer to After Visit Summary for other counseling recommendations.  ? ?Return in 6 days (on 07/06/2021) for in person, high risk ob, md visit. ? ?Future Appointments  ?Date Time Provider [place]  ?07/01/2021  1:30 PM [facility] NURSE [facility]  ?07/01/2021  1:45 PM [facility] US4 [facility]  ?07/06/2021 10:30 AM [facility] NURSE [facility]  ?07/06/2021 10:45 AM [facility] US4 [facility]  ? ? ?Aletha Halim, MD ? ?

## 2021-07-01 ENCOUNTER — Ambulatory Visit: Payer: Medicaid Other | Attending: Obstetrics

## 2021-07-01 ENCOUNTER — Ambulatory Visit: Payer: Medicaid Other | Admitting: *Deleted

## 2021-07-01 VITALS — BP 120/64 | HR 89

## 2021-07-01 DIAGNOSIS — O0990 Supervision of high risk pregnancy, unspecified, unspecified trimester: Secondary | ICD-10-CM

## 2021-07-01 DIAGNOSIS — Z3A36 36 weeks gestation of pregnancy: Secondary | ICD-10-CM | POA: Diagnosis not present

## 2021-07-01 DIAGNOSIS — O09523 Supervision of elderly multigravida, third trimester: Secondary | ICD-10-CM | POA: Insufficient documentation

## 2021-07-01 DIAGNOSIS — O4403 Placenta previa specified as without hemorrhage, third trimester: Secondary | ICD-10-CM | POA: Insufficient documentation

## 2021-07-01 LAB — CERVICOVAGINAL ANCILLARY ONLY
Chlamydia: NEGATIVE
Comment: NEGATIVE
Comment: NEGATIVE
Comment: NORMAL
Neisseria Gonorrhea: NEGATIVE
Trichomonas: NEGATIVE

## 2021-07-01 NOTE — Patient Instructions (Signed)
Jessica Ewing ? 07/01/2021 ? ? Your procedure is scheduled on:  07/08/2021 ? Arrive at [employer] at [place] on [address] at [facility]  and [address]. You are invited to use the FREE valet parking or use the Visitor's parking deck. ? Pick up the phone at the desk and dial [phone]. ? Call this number if you have problems the morning of surgery: [phone] ? Remember: ? ? Do not eat food:(After Midnight) Desp?s de medianoche. ? Do not drink clear liquids: (After Midnight) Desp?s de medianoche. ? Take these medicines the morning of surgery with A SIP OF WATER:  none ? ? Do not wear jewelry, make-up or nail polish. ? Do not wear lotions, powders, or perfumes. Do not wear deodorant. ? Do not shave 48 hours prior to surgery. ? Do not bring valuables to the hospital.  [facility] is not  ? responsible for any belongings or valuables brought to the hospital. ? Contacts, dentures or bridgework may not be worn into surgery. ? Leave suitcase in the car. After surgery it may be brought to your room. ? For patients admitted to the hospital, checkout time is 11:00 AM the day of  ?            discharge. ? ?   ? Please read over the following fact sheets that you were given:  ?   Preparing for Surgery ? ? ?

## 2021-07-01 NOTE — Pre-Procedure Instructions (Signed)
[id number] interpreter number  ?

## 2021-07-01 NOTE — Pre-Procedure Instructions (Signed)
Voicemail was full on 3/24 ?

## 2021-07-02 LAB — STREP GP B NAA: Strep Gp B NAA: NEGATIVE

## 2021-07-04 ENCOUNTER — Encounter (HOSPITAL_COMMUNITY): Payer: Self-pay

## 2021-07-04 NOTE — Pre-Procedure Instructions (Signed)
Interpreter number [phone] ?

## 2021-07-06 ENCOUNTER — Ambulatory Visit (HOSPITAL_BASED_OUTPATIENT_CLINIC_OR_DEPARTMENT_OTHER): Payer: Medicaid Other

## 2021-07-06 ENCOUNTER — Other Ambulatory Visit: Payer: Self-pay | Admitting: Obstetrics and Gynecology

## 2021-07-06 ENCOUNTER — Ambulatory Visit (INDEPENDENT_AMBULATORY_CARE_PROVIDER_SITE_OTHER): Payer: Medicaid Other | Admitting: Obstetrics and Gynecology

## 2021-07-06 ENCOUNTER — Other Ambulatory Visit: Payer: Self-pay

## 2021-07-06 ENCOUNTER — Other Ambulatory Visit (HOSPITAL_COMMUNITY)
Admission: RE | Admit: 2021-07-06 | Discharge: 2021-07-06 | Disposition: A | Discharge status: Home / Self Care | Payer: Medicaid Other | Source: Ambulatory Visit | Attending: Obstetrics | Admitting: Obstetrics

## 2021-07-06 ENCOUNTER — Ambulatory Visit: Payer: Medicaid Other | Admitting: *Deleted

## 2021-07-06 VITALS — BP 112/69 | HR 90 | Wt 139.0 lb

## 2021-07-06 VITALS — BP 112/66 | HR 82

## 2021-07-06 DIAGNOSIS — O4443 Low lying placenta NOS or without hemorrhage, third trimester: Secondary | ICD-10-CM

## 2021-07-06 DIAGNOSIS — Z3A36 36 weeks gestation of pregnancy: Secondary | ICD-10-CM | POA: Insufficient documentation

## 2021-07-06 DIAGNOSIS — O4403 Placenta previa specified as without hemorrhage, third trimester: Secondary | ICD-10-CM

## 2021-07-06 DIAGNOSIS — O0990 Supervision of high risk pregnancy, unspecified, unspecified trimester: Secondary | ICD-10-CM

## 2021-07-06 DIAGNOSIS — O09523 Supervision of elderly multigravida, third trimester: Secondary | ICD-10-CM | POA: Diagnosis not present

## 2021-07-06 DIAGNOSIS — O09513 Supervision of elderly primigravida, third trimester: Secondary | ICD-10-CM | POA: Diagnosis not present

## 2021-07-06 DIAGNOSIS — Z6740 Type O blood, Rh positive: Secondary | ICD-10-CM | POA: Insufficient documentation

## 2021-07-06 HISTORY — DX: Low lying placenta nos or without hemorrhage, unspecified trimester: O44.40

## 2021-07-06 LAB — CBC
HCT: 33.5 % — ABNORMAL LOW (ref 36.0–46.0)
Hemoglobin: 10.7 g/dL — ABNORMAL LOW (ref 12.0–15.0)
MCH: 25.1 pg — ABNORMAL LOW (ref 26.0–34.0)
MCHC: 31.9 g/dL (ref 30.0–36.0)
MCV: 78.5 fL — ABNORMAL LOW (ref 80.0–100.0)
Platelets: 377 10*3/uL (ref 150–400)
RBC: 4.27 MIL/uL (ref 3.87–5.11)
RDW: 15.5 % (ref 11.5–15.5)
WBC: 12.2 10*3/uL — ABNORMAL HIGH (ref 4.0–10.5)
nRBC: 0 % (ref 0.0–0.2)

## 2021-07-06 LAB — TYPE AND SCREEN
ABO/RH(D): O POS
Antibody Screen: NEGATIVE

## 2021-07-06 NOTE — Progress Notes (Signed)
? ?  PRENATAL VISIT NOTE ? ?Subjective:  ?Jessica Ewing is a 41 y.o. [id number] at [redacted]w[redacted]d being seen today for ongoing prenatal care.  She is currently monitored for the following issues for this high-risk pregnancy and has Supervision of high risk pregnancy, antepartum; AMA (advanced maternal age) multigravida 35+; and Low lying placenta nos or without hemorrhage, third trimester on their problem list. ? ?Patient doing well with no acute concerns today. She reports no complaints.  Contractions: Irregular. Vag. Bleeding: None.  Movement: Present. Denies leaking of fluid.  ? ?The following portions of the patient's history were reviewed and updated as appropriate: allergies, current medications, past family history, past medical history, past social history, past surgical history and problem list. Problem list updated. ? ?Objective:  ? ?Vitals:  ? 07/06/21 1353  ?BP: 112/69  ?Pulse: 90  ?Weight: 139 lb (63 kg)  ? ? ?Fetal Status: Fetal Heart Rate (bpm): 135 Fundal Height: 37 cm Movement: Present    ? ?General:  Alert, oriented and cooperative. Patient is in no acute distress.  ?Skin: Skin is warm and dry. No rash noted.   ?Cardiovascular: Normal heart rate noted  ?Respiratory: Normal respiratory effort, no problems with respiration noted  ?Abdomen: Soft, gravid, appropriate for gestational age.  Pain/Pressure: Present     ?Pelvic: Cervical exam deferred        ?Extremities: Normal range of motion.     ?Mental Status:  Normal mood and affect. Normal behavior. Normal judgment and thought content.  ? ?Assessment and Plan:  ?Pregnancy: [id number] at [redacted]w[redacted]d ? ?1. [redacted] weeks gestation of pregnancy ? ? ?2. Supervision of high risk pregnancy, antepartum ?Pt scheduled for primary section on 07/08/21 due to posterior low lying placenta/previa ? ?3. Multigravida of advanced maternal age in third trimester ? ? ?4. Low lying placenta nos or without hemorrhage, third trimester ?See above ? ?Term labor symptoms and general obstetric precautions  including but not limited to vaginal bleeding, contractions, leaking of fluid and fetal movement were reviewed in detail with the patient. ? ?Please refer to After Visit Summary for other counseling recommendations.  ? ?Return in about 1 week (around 07/13/2021) for [facility], in person. ? ? ?Lynnda Shields, MD ?Faculty Attending ?[facility] [place] ?  ?

## 2021-07-07 LAB — RPR: RPR Ser Ql: NONREACTIVE

## 2021-07-07 NOTE — Progress Notes (Signed)
Opened in error

## 2021-07-08 ENCOUNTER — Inpatient Hospital Stay (HOSPITAL_COMMUNITY): Admission: RE | Admit: 2021-07-08 | Payer: Medicaid Other | Source: Home / Self Care | Admitting: Family Medicine

## 2021-07-08 ENCOUNTER — Encounter (HOSPITAL_COMMUNITY): Payer: Self-pay | Admitting: Obstetrics & Gynecology

## 2021-07-08 ENCOUNTER — Other Ambulatory Visit: Payer: Self-pay

## 2021-07-08 ENCOUNTER — Inpatient Hospital Stay (HOSPITAL_COMMUNITY)
Admission: AD | Admit: 2021-07-08 | Discharge: 2021-07-10 | DRG: 787 | Disposition: A | Discharge status: Home / Self Care | Payer: Medicaid Other | Attending: Obstetrics & Gynecology | Admitting: Obstetrics & Gynecology

## 2021-07-08 ENCOUNTER — Inpatient Hospital Stay (HOSPITAL_COMMUNITY): Payer: Medicaid Other | Admitting: Anesthesiology

## 2021-07-08 ENCOUNTER — Encounter (HOSPITAL_COMMUNITY)
Admission: AD | Disposition: A | Discharge status: Home / Self Care | Payer: Self-pay | Source: Home / Self Care | Attending: Obstetrics & Gynecology

## 2021-07-08 DIAGNOSIS — D62 Acute posthemorrhagic anemia: Secondary | ICD-10-CM | POA: Diagnosis not present

## 2021-07-08 DIAGNOSIS — O9902 Anemia complicating childbirth: Secondary | ICD-10-CM | POA: Diagnosis not present

## 2021-07-08 DIAGNOSIS — Z419 Encounter for procedure for purposes other than remedying health state, unspecified: Secondary | ICD-10-CM | POA: Diagnosis not present

## 2021-07-08 DIAGNOSIS — O4693 Antepartum hemorrhage, unspecified, third trimester: Principal | ICD-10-CM

## 2021-07-08 DIAGNOSIS — O4443 Low lying placenta NOS or without hemorrhage, third trimester: Secondary | ICD-10-CM | POA: Diagnosis present

## 2021-07-08 DIAGNOSIS — D649 Anemia, unspecified: Secondary | ICD-10-CM

## 2021-07-08 DIAGNOSIS — O9081 Anemia of the puerperium: Secondary | ICD-10-CM | POA: Diagnosis not present

## 2021-07-08 DIAGNOSIS — O0990 Supervision of high risk pregnancy, unspecified, unspecified trimester: Secondary | ICD-10-CM

## 2021-07-08 DIAGNOSIS — Z3A37 37 weeks gestation of pregnancy: Secondary | ICD-10-CM

## 2021-07-08 DIAGNOSIS — O26893 Other specified pregnancy related conditions, third trimester: Secondary | ICD-10-CM | POA: Diagnosis not present

## 2021-07-08 DIAGNOSIS — O09529 Supervision of elderly multigravida, unspecified trimester: Secondary | ICD-10-CM

## 2021-07-08 DIAGNOSIS — O4440 Low lying placenta NOS or without hemorrhage, unspecified trimester: Secondary | ICD-10-CM | POA: Diagnosis present

## 2021-07-08 LAB — CBC
HCT: 35.4 % — ABNORMAL LOW (ref 36.0–46.0)
Hemoglobin: 11.2 g/dL — ABNORMAL LOW (ref 12.0–15.0)
MCH: 24.7 pg — ABNORMAL LOW (ref 26.0–34.0)
MCHC: 31.6 g/dL (ref 30.0–36.0)
MCV: 78 fL — ABNORMAL LOW (ref 80.0–100.0)
Platelets: 391 10*3/uL (ref 150–400)
RBC: 4.54 MIL/uL (ref 3.87–5.11)
RDW: 15.5 % (ref 11.5–15.5)
WBC: 15 10*3/uL — ABNORMAL HIGH (ref 4.0–10.5)
nRBC: 0 % (ref 0.0–0.2)

## 2021-07-08 SURGERY — Surgical Case
Anesthesia: Spinal

## 2021-07-08 MED ORDER — LACTATED RINGERS IV SOLN: INTRAVENOUS | Status: DC

## 2021-07-08 MED ORDER — OXYCODONE HCL 5 MG PO TABS
5.0000 mg | ORAL_TABLET | ORAL | Status: DC | PRN
  Administered 2021-07-09 (×2): 5 mg via ORAL
  Administered 2021-07-09: 10 mg via ORAL
  Administered 2021-07-09: 5 mg via ORAL
  Administered 2021-07-10 (×2): 10 mg via ORAL
  Administered 2021-07-10: 5 mg via ORAL
  Filled 2021-07-08 (×2): qty 1
  Filled 2021-07-08 (×3): qty 2
  Filled 2021-07-08 (×2): qty 1

## 2021-07-08 MED ORDER — KETOROLAC TROMETHAMINE 30 MG/ML IJ SOLN
30.0000 mg | Freq: Four times a day (QID) | INTRAMUSCULAR | Status: AC
  Administered 2021-07-08 – 2021-07-09 (×3): 30 mg via INTRAVENOUS
  Filled 2021-07-08 (×3): qty 1

## 2021-07-08 MED ORDER — ENOXAPARIN SODIUM 40 MG/0.4ML IJ SOSY
40.0000 mg | PREFILLED_SYRINGE | INTRAMUSCULAR | Status: DC
  Administered 2021-07-08 – 2021-07-09 (×2): 40 mg via SUBCUTANEOUS
  Filled 2021-07-08 (×2): qty 0.4

## 2021-07-08 MED ORDER — PHENYLEPHRINE 40 MCG/ML (10ML) SYRINGE FOR IV PUSH (FOR BLOOD PRESSURE SUPPORT)
PREFILLED_SYRINGE | INTRAVENOUS | Status: AC
  Filled 2021-07-08: qty 10

## 2021-07-08 MED ORDER — OXYTOCIN-SODIUM CHLORIDE 30-0.9 UT/500ML-% IV SOLN
INTRAVENOUS | Status: DC | PRN
  Administered 2021-07-08: 30 [IU] via INTRAVENOUS

## 2021-07-08 MED ORDER — TETANUS-DIPHTH-ACELL PERTUSSIS 5-2.5-18.5 LF-MCG/0.5 IM SUSY: 0.5000 mL | PREFILLED_SYRINGE | Freq: Once | INTRAMUSCULAR | Status: DC

## 2021-07-08 MED ORDER — DEXAMETHASONE SODIUM PHOSPHATE 10 MG/ML IJ SOLN
INTRAMUSCULAR | Status: AC
  Filled 2021-07-08: qty 1

## 2021-07-08 MED ORDER — MEDROXYPROGESTERONE ACETATE 150 MG/ML IM SUSP: 150.0000 mg | INTRAMUSCULAR | Status: DC | PRN

## 2021-07-08 MED ORDER — MORPHINE SULFATE (PF) 0.5 MG/ML IJ SOLN
INTRAMUSCULAR | Status: DC | PRN
  Administered 2021-07-08: .15 mg via INTRATHECAL

## 2021-07-08 MED ORDER — ACETAMINOPHEN 10 MG/ML IV SOLN: 1000.0000 mg | Freq: Once | INTRAVENOUS | Status: DC | PRN

## 2021-07-08 MED ORDER — FENTANYL CITRATE (PF) 100 MCG/2ML IJ SOLN: 25.0000 ug | INTRAMUSCULAR | Status: DC | PRN

## 2021-07-08 MED ORDER — ONDANSETRON HCL 4 MG/2ML IJ SOLN
INTRAMUSCULAR | Status: DC | PRN
  Administered 2021-07-08: 4 mg via INTRAVENOUS

## 2021-07-08 MED ORDER — NALOXONE HCL 0.4 MG/ML IJ SOLN: 0.4000 mg | INTRAMUSCULAR | Status: DC | PRN

## 2021-07-08 MED ORDER — KETOROLAC TROMETHAMINE 30 MG/ML IJ SOLN
INTRAMUSCULAR | Status: AC
  Filled 2021-07-08: qty 1

## 2021-07-08 MED ORDER — OXYTOCIN-SODIUM CHLORIDE 30-0.9 UT/500ML-% IV SOLN
2.5000 [IU]/h | INTRAVENOUS | Status: AC
  Administered 2021-07-08: 2.5 [IU]/h via INTRAVENOUS
  Filled 2021-07-08: qty 500

## 2021-07-08 MED ORDER — MEPERIDINE HCL 25 MG/ML IJ SOLN: 6.2500 mg | INTRAMUSCULAR | Status: DC | PRN

## 2021-07-08 MED ORDER — DIPHENHYDRAMINE HCL 25 MG PO CAPS: 25.0000 mg | ORAL_CAPSULE | Freq: Four times a day (QID) | ORAL | Status: DC | PRN

## 2021-07-08 MED ORDER — NALOXONE HCL 4 MG/10ML IJ SOLN
1.0000 ug/kg/h | INTRAVENOUS | Status: DC | PRN
  Filled 2021-07-08: qty 5

## 2021-07-08 MED ORDER — IBUPROFEN 600 MG PO TABS
600.0000 mg | ORAL_TABLET | Freq: Four times a day (QID) | ORAL | Status: DC
  Administered 2021-07-09 – 2021-07-10 (×5): 600 mg via ORAL
  Filled 2021-07-08 (×5): qty 1

## 2021-07-08 MED ORDER — SIMETHICONE 80 MG PO CHEW
80.0000 mg | CHEWABLE_TABLET | Freq: Three times a day (TID) | ORAL | Status: DC
  Administered 2021-07-08 – 2021-07-10 (×6): 80 mg via ORAL
  Filled 2021-07-08 (×6): qty 1

## 2021-07-08 MED ORDER — DEXAMETHASONE SODIUM PHOSPHATE 10 MG/ML IJ SOLN
INTRAMUSCULAR | Status: DC | PRN
  Administered 2021-07-08: 10 mg via INTRAVENOUS

## 2021-07-08 MED ORDER — STERILE WATER FOR IRRIGATION IR SOLN
Status: DC | PRN
  Administered 2021-07-08: 1000 mL

## 2021-07-08 MED ORDER — PHENYLEPHRINE HCL-NACL 20-0.9 MG/250ML-% IV SOLN
INTRAVENOUS | Status: AC
  Filled 2021-07-08: qty 250

## 2021-07-08 MED ORDER — KETOROLAC TROMETHAMINE 30 MG/ML IJ SOLN
30.0000 mg | Freq: Four times a day (QID) | INTRAMUSCULAR | Status: DC | PRN
  Administered 2021-07-08: 30 mg via INTRAVENOUS

## 2021-07-08 MED ORDER — KETOROLAC TROMETHAMINE 30 MG/ML IJ SOLN: 30.0000 mg | Freq: Four times a day (QID) | INTRAMUSCULAR | Status: DC | PRN

## 2021-07-08 MED ORDER — PHENYLEPHRINE HCL-NACL 20-0.9 MG/250ML-% IV SOLN
INTRAVENOUS | Status: DC | PRN
  Administered 2021-07-08: 60 ug/min via INTRAVENOUS

## 2021-07-08 MED ORDER — MEASLES, MUMPS & RUBELLA VAC IJ SOLR: 0.5000 mL | Freq: Once | INTRAMUSCULAR | Status: DC

## 2021-07-08 MED ORDER — FENTANYL CITRATE (PF) 100 MCG/2ML IJ SOLN
INTRAMUSCULAR | Status: DC | PRN
  Administered 2021-07-08: 15 ug via INTRATHECAL

## 2021-07-08 MED ORDER — ACETAMINOPHEN 500 MG PO TABS: 1000.0000 mg | ORAL_TABLET | Freq: Four times a day (QID) | ORAL | Status: DC

## 2021-07-08 MED ORDER — ACETAMINOPHEN 10 MG/ML IV SOLN
INTRAVENOUS | Status: DC | PRN
  Administered 2021-07-08: 1000 mg via INTRAVENOUS

## 2021-07-08 MED ORDER — DIPHENHYDRAMINE HCL 25 MG PO CAPS: 25.0000 mg | ORAL_CAPSULE | ORAL | Status: DC | PRN

## 2021-07-08 MED ORDER — POVIDONE-IODINE 10 % EX SWAB
2.0000 "application " | Freq: Once | CUTANEOUS | Status: AC
  Administered 2021-07-08: 2 via TOPICAL

## 2021-07-08 MED ORDER — PHENYLEPHRINE HCL (PRESSORS) 10 MG/ML IV SOLN
INTRAVENOUS | Status: DC | PRN
  Administered 2021-07-08: 80 ug via INTRAVENOUS

## 2021-07-08 MED ORDER — SIMETHICONE 80 MG PO CHEW: 80.0000 mg | CHEWABLE_TABLET | ORAL | Status: DC | PRN

## 2021-07-08 MED ORDER — OXYTOCIN-SODIUM CHLORIDE 30-0.9 UT/500ML-% IV SOLN
INTRAVENOUS | Status: AC
  Filled 2021-07-08: qty 500

## 2021-07-08 MED ORDER — ONDANSETRON HCL 4 MG/2ML IJ SOLN: 4.0000 mg | Freq: Three times a day (TID) | INTRAMUSCULAR | Status: DC | PRN

## 2021-07-08 MED ORDER — SODIUM CHLORIDE 0.9% FLUSH: 3.0000 mL | INTRAVENOUS | Status: DC | PRN

## 2021-07-08 MED ORDER — BUPIVACAINE IN DEXTROSE 0.75-8.25 % IT SOLN
INTRATHECAL | Status: DC | PRN
Start: 2021-07-08 — End: 2021-07-08
  Administered 2021-07-08: 1.6 mL via INTRATHECAL

## 2021-07-08 MED ORDER — SOD CITRATE-CITRIC ACID 500-334 MG/5ML PO SOLN
30.0000 mL | ORAL | Status: AC
  Administered 2021-07-08: 30 mL via ORAL
  Filled 2021-07-08: qty 30

## 2021-07-08 MED ORDER — DIPHENHYDRAMINE HCL 50 MG/ML IJ SOLN: 12.5000 mg | INTRAMUSCULAR | Status: DC | PRN

## 2021-07-08 MED ORDER — DIBUCAINE (PERIANAL) 1 % EX OINT: 1.0000 "application " | TOPICAL_OINTMENT | CUTANEOUS | Status: DC | PRN

## 2021-07-08 MED ORDER — SODIUM CHLORIDE 0.9 % IR SOLN
Status: DC | PRN
  Administered 2021-07-08: 1

## 2021-07-08 MED ORDER — WITCH HAZEL-GLYCERIN EX PADS: 1.0000 "application " | MEDICATED_PAD | CUTANEOUS | Status: DC | PRN

## 2021-07-08 MED ORDER — MENTHOL 3 MG MT LOZG: 1.0000 | LOZENGE | OROMUCOSAL | Status: DC | PRN

## 2021-07-08 MED ORDER — CEFAZOLIN SODIUM-DEXTROSE 2-4 GM/100ML-% IV SOLN
2.0000 g | INTRAVENOUS | Status: AC
  Administered 2021-07-08: 2 g via INTRAVENOUS

## 2021-07-08 MED ORDER — MAGNESIUM HYDROXIDE 400 MG/5ML PO SUSP: 30.0000 mL | ORAL | Status: DC | PRN

## 2021-07-08 MED ORDER — ACETAMINOPHEN 500 MG PO TABS
1000.0000 mg | ORAL_TABLET | Freq: Four times a day (QID) | ORAL | Status: DC
  Administered 2021-07-08 – 2021-07-10 (×9): 1000 mg via ORAL
  Filled 2021-07-08 (×9): qty 2

## 2021-07-08 MED ORDER — FENTANYL CITRATE (PF) 100 MCG/2ML IJ SOLN
INTRAMUSCULAR | Status: AC
  Filled 2021-07-08: qty 2

## 2021-07-08 MED ORDER — MORPHINE SULFATE (PF) 0.5 MG/ML IJ SOLN
INTRAMUSCULAR | Status: AC
  Filled 2021-07-08: qty 10

## 2021-07-08 MED ORDER — PRENATAL MULTIVITAMIN CH
1.0000 | ORAL_TABLET | Freq: Every day | ORAL | Status: DC
  Administered 2021-07-08 – 2021-07-10 (×3): 1 via ORAL
  Filled 2021-07-08 (×3): qty 1

## 2021-07-08 MED ORDER — OXYCODONE HCL 5 MG/5ML PO SOLN: 5.0000 mg | Freq: Once | ORAL | Status: DC | PRN

## 2021-07-08 MED ORDER — AMISULPRIDE (ANTIEMETIC) 5 MG/2ML IV SOLN
10.0000 mg | Freq: Once | INTRAVENOUS | Status: DC | PRN
  Filled 2021-07-08: qty 4

## 2021-07-08 MED ORDER — OXYCODONE HCL 5 MG PO TABS: 5.0000 mg | ORAL_TABLET | Freq: Once | ORAL | Status: DC | PRN

## 2021-07-08 MED ORDER — ONDANSETRON HCL 4 MG/2ML IJ SOLN: 4.0000 mg | Freq: Once | INTRAMUSCULAR | Status: DC | PRN

## 2021-07-08 MED ORDER — ONDANSETRON HCL 4 MG/2ML IJ SOLN
INTRAMUSCULAR | Status: AC
  Filled 2021-07-08: qty 2

## 2021-07-08 MED ORDER — COCONUT OIL OIL: 1.0000 "application " | TOPICAL_OIL | Status: DC | PRN

## 2021-07-08 MED ORDER — SENNOSIDES-DOCUSATE SODIUM 8.6-50 MG PO TABS
2.0000 | ORAL_TABLET | Freq: Every day | ORAL | Status: DC
  Administered 2021-07-09 – 2021-07-10 (×2): 2 via ORAL
  Filled 2021-07-08 (×2): qty 2

## 2021-07-08 SURGICAL SUPPLY — 33 items
BENZOIN TINCTURE PRP APPL 2/3 (GAUZE/BANDAGES/DRESSINGS) ×2 IMPLANT
CLAMP CORD UMBIL (MISCELLANEOUS) ×2 IMPLANT
CLOSURE STERI STRIP 1/2 X4 (GAUZE/BANDAGES/DRESSINGS) ×1 IMPLANT
CLOTH BEACON ORANGE TIMEOUT ST (SAFETY) ×2 IMPLANT
DRSG OPSITE POSTOP 4X10 (GAUZE/BANDAGES/DRESSINGS) ×2 IMPLANT
DRSG OPSITE POSTOP 4X12 (GAUZE/BANDAGES/DRESSINGS) ×1 IMPLANT
ELECT REM PT RETURN 9FT ADLT (ELECTROSURGICAL) ×2
ELECTRODE REM PT RTRN 9FT ADLT (ELECTROSURGICAL) ×1 IMPLANT
EXTRACTOR VACUUM M CUP 4 TUBE (SUCTIONS) IMPLANT
GLOVE BIOGEL PI IND STRL 7.0 (GLOVE) ×3 IMPLANT
GLOVE BIOGEL PI INDICATOR 7.0 (GLOVE) ×3
GLOVE ECLIPSE 7.0 STRL STRAW (GLOVE) ×2 IMPLANT
GOWN STRL REUS W/TWL LRG LVL3 (GOWN DISPOSABLE) ×4 IMPLANT
KIT ABG SYR 3ML LUER SLIP (SYRINGE) ×2 IMPLANT
NDL HYPO 25X5/8 SAFETYGLIDE (NEEDLE) ×1 IMPLANT
NEEDLE HYPO 22GX1.5 SAFETY (NEEDLE) ×2 IMPLANT
NEEDLE HYPO 25X5/8 SAFETYGLIDE (NEEDLE) ×2 IMPLANT
NS IRRIG 1000ML POUR BTL (IV SOLUTION) ×2 IMPLANT
PACK C SECTION WH (CUSTOM PROCEDURE TRAY) ×2 IMPLANT
PAD ABD 7.5X8 STRL (GAUZE/BANDAGES/DRESSINGS) ×2 IMPLANT
PAD OB MATERNITY 4.3X12.25 (PERSONAL CARE ITEMS) ×2 IMPLANT
PENCIL SMOKE EVAC W/HOLSTER (ELECTROSURGICAL) ×2 IMPLANT
RTRCTR C-SECT PINK 25CM LRG (MISCELLANEOUS) ×2 IMPLANT
STRIP CLOSURE SKIN 1/2X4 (GAUZE/BANDAGES/DRESSINGS) ×2 IMPLANT
SUT MNCRL 0 VIOLET CTX 36 (SUTURE) ×2 IMPLANT
SUT MONOCRYL 0 CTX 36 (SUTURE) ×2
SUT VIC AB 0 CTX 36 (SUTURE) ×1
SUT VIC AB 0 CTX36XBRD ANBCTRL (SUTURE) ×1 IMPLANT
SUT VIC AB 4-0 KS 27 (SUTURE) ×2 IMPLANT
SYR 30ML LL (SYRINGE) ×2 IMPLANT
TOWEL OR 17X24 6PK STRL BLUE (TOWEL DISPOSABLE) ×2 IMPLANT
TRAY FOLEY W/BAG SLVR 14FR LF (SET/KITS/TRAYS/PACK) ×2 IMPLANT
WATER STERILE IRR 1000ML POUR (IV SOLUTION) ×2 IMPLANT

## 2021-07-08 NOTE — Anesthesia Procedure Notes (Signed)
Spinal ? ?Patient location during procedure: OR ?Start time: 07/08/2021 5:55 AM ?End time: 07/08/2021 6:00 AM ?Reason for block: surgical anesthesia ?Staffing ?Performed: anesthesiologist  ?Anesthesiologist: Mellody Dance, MD ?Preanesthetic Checklist ?Completed: patient identified, IV checked, risks and benefits discussed, surgical consent, monitors and equipment checked, pre-op evaluation and timeout performed ?Spinal Block ?Patient position: sitting ?Prep: DuraPrep ?Patient monitoring: cardiac monitor, continuous pulse ox and blood pressure ?Approach: midline ?Location: L3-4 ?Injection technique: single-shot ?Needle ?Needle type: Pencan  ?Needle gauge: 24 G ?Needle length: 9 cm ?Assessment ?Events: CSF return ?Additional Notes ?Functioning IV was confirmed and monitors were applied. Sterile prep and drape, including hand hygiene and sterile gloves were used. The patient was positioned and the spine was prepped. The skin was anesthetized with lidocaine.  Free flow of clear CSF was obtained prior to injecting local anesthetic into the CSF.  The spinal needle aspirated freely following injection.  The needle was carefully withdrawn.  The patient tolerated the procedure well.  ? ? ? ?

## 2021-07-08 NOTE — Discharge Summary (Signed)
? ?  Postpartum Discharge Summary ? ?   ?Patient Name: Jessica Ewing ?DOB: February 23, 1981 ?MRN: [id number] ? ?Date of admission: 07/08/2021 ?Delivery date:07/08/2021  ?Delivering provider: Janyth Pupa  ?Date of discharge: 07/10/2021 ? ?Admitting diagnosis: Low-lying placenta [O44.40] ?Delivery of pregnancy by cesarean section [O82] ?Intrauterine pregnancy: [redacted]w[redacted]d    ?Secondary diagnosis:  Principal Problem: ?  Delivery of pregnancy by cesarean section ?Active Problems: ?  Supervision of high risk pregnancy, antepartum ?  AMA (advanced maternal age) multigravida 358+?  Low-lying placenta ? ?Additional problems: Acute blood loss anemia s/p IV Venofer     ?Discharge diagnosis: Term Pregnancy Delivered                                              ?Post partum procedures: None ?Augmentation: N/A ?Complications: None ? ?Hospital course: Sceduled C/S - 41y.o. yo [phone] 41w1das admitted to the hospital 41/31/2023 for scheduled cesarean section with the following indication: Low lying placenta, scheduled for later in the day but started to have some vaginal bleeding/contractions . Delivery details are as follows:  ?Membrane Rupture Time/Date: 6:20 AM ,07/08/2021   ?Delivery Method:C-Section, Low Transverse  ?Details of operation can be found in separate operative note.  Patient had an uncomplicated postpartum course.  She received IV Venofer for acute blood loss anemia with good result.  She remained asymptomatic from this while inpatient.  She is ambulating, tolerating a regular diet, passing flatus, and urinating well. Patient is discharged home in stable condition on  07/10/21 ?       ?Newborn Data: ?Birth date:07/[birth year]/2021  ?Birth time:6:21 AM  ?Gender:Female  ?Living status:Living  ?Apgars:8 ,9  ?Weight:2637 g    ? ?Magnesium Sulfate received: No ?BMZ received: No ?Rhophylac: N/A ?MMR: N/A ?T-DaP: Given prenatally ?Flu: No ?Transfusion: No ? ?Physical exam  ?Vitals:  ? 07/09/21 1636 07/09/21 1900 07/09/21 2052 07/10/21 0534   ?BP: 91/66  105/60 (!) 100/57  ?Pulse: 77  75 68  ?Resp: '18  17 18  ' ?Temp: 98.9 ?F (37.2 ?C)  98.3 ?F (36.8 ?C) 98.4 ?F (36.9 ?C)  ?TempSrc:   Oral Oral  ?SpO2: 98% 98% 98%   ?Weight:      ?Height:      ? ?General: alert, cooperative, and no distress ?Lochia: appropriate ?Uterine Fundus: firm and below umbilicus  ?Incision: healing well with no significant drainage, no significant erythema, dressing is clean, dry, and intact ?DVT Evaluation: no LE edema or calf tenderness to palpation  ? ?Labs: ?Lab Results  ?Component Value Date  ? WBC 17.5 (H) 07/09/2021  ? HGB 8.4 (L) 07/09/2021  ? HCT 24.8 (L) 07/09/2021  ? MCV 76.3 (L) 07/09/2021  ? PLT 277 07/09/2021  ? ? ?  Latest Ref Rng & Units 12/05/2019  ? 10:52 AM  ?CMP  ?Glucose 65 - 99 mg/dL 82    ?BUN 6 - 20 mg/dL 9    ?Creatinine 0.57 - 1.00 mg/dL 0.61    ?Sodium 134 - 144 mmol/L 135    ?Potassium 3.5 - 5.2 mmol/L 4.4    ?Chloride 96 - 106 mmol/L 101    ?CO2 20 - 29 mmol/L 20    ?Calcium 8.7 - 10.2 mg/dL 8.9    ?Total Protein 6.0 - 8.5 g/dL 6.4    ?Total Bilirubin 0.0 - 1.2 mg/dL 0.3    ?Alkaline Phos  48 - 121 IU/L 95    ?AST 0 - 40 IU/L 13    ?ALT 0 - 32 IU/L 12    ? ?Edinburgh Score: ? ?  07/08/2021  ?  9:28 AM  ?Flavia Shipper Postnatal Depression Scale Screening Tool  ?I have been able to laugh and see the funny side of things. 0  ?I have looked forward with enjoyment to things. 1  ?I have blamed myself unnecessarily when things went wrong. 1  ?I have been anxious or worried for no good reason. 0  ?I have felt scared or panicky for no good reason. 0  ?Things have been getting on top of me. 0  ?I have been so unhappy that I have had difficulty sleeping. 0  ?I have felt sad or miserable. 1  ?I have been so unhappy that I have been crying. 1  ?The thought of harming myself has occurred to me. 0  ?Edinburgh Postnatal Depression Scale Total 4  ? ? ? ?After visit meds:  ?Allergies as of 07/10/2021   ?No Known Allergies ?  ? ?  ?Medication List  ?  ? ?STOP taking these  medications   ? ?aspirin EC 81 MG tablet ?  ? ?  ? ?TAKE these medications   ? ?acetaminophen 500 MG tablet ?Commonly known as: TYLENOL ?Take 2 tablets (1,000 mg total) by mouth every 8 (eight) hours as needed (pain). ?  ?ibuprofen 600 MG tablet ?Commonly known as: ADVIL ?Take 1 tablet (600 mg total) by mouth every 6 (six) hours as needed (pain). ?  ?oxyCODONE 5 MG immediate release tablet ?Commonly known as: Oxy IR/ROXICODONE ?Take 1 tablet (5 mg total) by mouth every 6 (six) hours as needed for severe pain or breakthrough pain. ?  ?Vitafol Gummies 3.33-0.333-34.8 MG Chew ?Chew 3 tablets by mouth daily. ?  ? ?  ? ?  ?  ? ? ?  ?Discharge Care Instructions  ?(From admission, onward)  ?  ? ? ?  ? ?  Start     Ordered  ? 07/10/21 0000  Discharge wound care:       ?Comments: Remove dressing 5 days after your surgery date (Wednesday 4/5). You can then wash the area gently with soap and water in the shower and pat dry. You will have an incision check in about 1 week.  ? 07/10/21 1356  ? ?  ?  ? ?  ? ? ? ?Discharge home in stable condition ?Infant Feeding: Bottle ?Infant Disposition: home with mother ?Discharge instruction: per After Visit Summary and Postpartum booklet. ?Activity: Advance as tolerated. Pelvic rest for 6 weeks.  ?Diet: routine diet ? ?Follow up Visit: ?Message sent to [facility] by Dr Higinio Plan  ?Please schedule this patient for a In person postpartum visit in 6 weeks with the following provider: Any provider. ?Additional Postpartum F/U: Incision check 1 week  ?High risk pregnancy complicated by:  Low lying placenta  ?Delivery mode:  C-Section, Low Transverse  ?Anticipated Birth Control:  Depo ? ?07/10/2021 ?Genia Del, MD ? ? ? ?

## 2021-07-08 NOTE — Anesthesia Postprocedure Evaluation (Signed)
Anesthesia Post Note ? ?Patient: Jessica Ewing ? ?Procedure(s) Performed: CESAREAN SECTION ? ?  ? ?Patient location during evaluation: PACU ?Anesthesia Type: Spinal ?Level of consciousness: awake and alert ?Pain management: pain level controlled ?Vital Signs Assessment: post-procedure vital signs reviewed and stable ?Respiratory status: spontaneous breathing and respiratory function stable ?Cardiovascular status: blood pressure returned to baseline and stable ?Postop Assessment: spinal receding ?Anesthetic complications: no ? ? ?No notable events documented. ? ?Last Vitals:  ?Vitals:  ? 07/08/21 0820 07/08/21 0928  ?BP: 99/66 98/65  ?Pulse: 66 67  ?Resp:    ?Temp: 36.5 ?C 36.6 ?C  ?SpO2: 100% 100%  ?  ?Last Pain:  ?Vitals:  ? 07/08/21 0928  ?TempSrc: Oral  ?PainSc: 2   ? ?Pain Goal:   ? ?  ?  ?  ?  ?  ?  ?  ? ?Tarick Parenteau DANIEL ? ? ? ? ?

## 2021-07-08 NOTE — Progress Notes (Signed)
Rn called Charge Rn Sara Chu regarding the visitation policy. RN stated that patient could switch out support person during night so Dad can take care of children. ?

## 2021-07-08 NOTE — Transfer of Care (Signed)
Immediate Anesthesia Transfer of Care Note ? ?Patient: Jessica Ewing ? ?Procedure(s) Performed: CESAREAN SECTION ? ?Patient Location: PACU ? ?Anesthesia Type:Spinal ? ?Level of Consciousness: awake, alert  and oriented ? ?Airway & Oxygen Therapy: Patient Spontanous Breathing ? ?Post-op Assessment: Report given to RN and Post -op Vital signs reviewed and stable ? ?Post vital signs: Reviewed and stable ? ?Last Vitals:  ?Vitals Value Taken Time  ?BP 108/67 07/08/21 0800  ?Temp 36.5 ?C 07/08/21 0715  ?Pulse 71 07/08/21 0807  ?Resp 15 07/08/21 0807  ?SpO2 100 % 07/08/21 0807  ?Vitals shown include unvalidated device data. ? ?Last Pain:  ?Vitals:  ? 07/08/21 0800  ?TempSrc:   ?PainSc: 0-No pain  ?   ? ?  ? ?Complications: No notable events documented. ?

## 2021-07-08 NOTE — Anesthesia Preprocedure Evaluation (Addendum)
Anesthesia Evaluation  ?Patient identified by MRN, date of birth, ID band ?Patient awake ? ? ? ?Reviewed: ?Allergy & Precautions, NPO status , Patient's Chart, lab work & pertinent test results ? ?Airway ?Mallampati: II ? ?TM Distance: >3 FB ?Neck ROM: Full ? ? ? Dental ?no notable dental hx. ? ?  ?Pulmonary ?neg pulmonary ROS,  ?  ?Pulmonary exam normal ?breath sounds clear to auscultation ? ? ? ? ? ? Cardiovascular ?negative cardio ROS ?Normal cardiovascular exam ?Rhythm:Regular Rate:Normal ? ? ?  ?Neuro/Psych ?negative neurological ROS ? negative psych ROS  ? GI/Hepatic ?negative GI ROS, Neg liver ROS,   ?Endo/Other  ?negative endocrine ROS ? Renal/GU ?negative Renal ROS  ?negative genitourinary ?  ?Musculoskeletal ?negative musculoskeletal ROS ?(+)  ? Abdominal ?  ?Peds ?negative pediatric ROS ?(+)  Hematology ? ?(+) Blood dyscrasia, anemia ,   ?Anesthesia Other Findings ? ? Reproductive/Obstetrics ?(+) Pregnancy ? ?  ? ? ? ? ? ? ? ? ? ? ? ? ? ?  ?  ? ? ? ? ? ? ? ? ?Anesthesia Physical ?Anesthesia Plan ? ?ASA: 2 ? ?Anesthesia Plan: Spinal  ? ?Post-op Pain Management:   ? ?Induction:  ? ?PONV Risk Score and Plan: 2 and Treatment may vary due to age or medical condition, Ondansetron, Dexamethasone and Scopolamine patch - Pre-op ? ?Airway Management Planned: Natural Airway ? ?Additional Equipment: None ? ?Intra-op Plan:  ? ?Post-operative Plan:  ? ?Informed Consent: I have reviewed the patients History and Physical, chart, labs and discussed the procedure including the risks, benefits and alternatives for the proposed anesthesia with the patient or authorized representative who has indicated his/her understanding and acceptance.  ? ? ? ?Dental advisory given and Consent reviewed with POA ? ?Plan Discussed with: Anesthesiologist and CRNA ? ?Anesthesia Plan Comments: (Spinal. GETA as backup plan. Tanna Furry, MD  ?)  ? ? ? ? ? ?Anesthesia Quick Evaluation ? ?

## 2021-07-08 NOTE — Progress Notes (Signed)
Interpreter used. ?Risk benefits and alternatives of cesarean section were discussed with the patient including but not limited to infection, bleeding, damage to bowel , bladder and baby with the need for further surgery. Pt voiced understanding and desires to proceed.   Plan for primary C-section due to low lying placenta and labor ? ?Myna Hidalgo, DO ?Attending Obstetrician & Gynecologist, Faculty Practice ?Center for [employer], [facility] ? ? ?

## 2021-07-08 NOTE — MAU Note (Signed)
PT SAYS WITH INTERPRETER CHANTY -  ?MAYBE SROM AT 0230 WITH BLOODY SHOW - WITH UC'S  ?[facility] WITH [facility]  ?NO VE  ?C/S [facility] TODAY AT 0730 BC OF LOW LYING PLACENTA  ? ?

## 2021-07-08 NOTE — Op Note (Signed)
[name] ?PROCEDURE DATE: 07/08/2021 ? ?PREOPERATIVE DIAGNOSIS: Intrauterine pregnancy at  [redacted]w[redacted]d weeks gestation;  low lying placenta (0.7cm from internal os) ? ?POSTOPERATIVE DIAGNOSIS: The same ? ?PROCEDURE: Primary Low Transverse Cesarean Section ? ?SURGEON:  Dr. Myna Hidalgo  ? ?ASSISTANT: Dr. Leticia Penna  ? ?INDICATIONS: Jessica Ewing is a 41 y.o. [id number] at [redacted]w[redacted]d scheduled for cesarean section secondary to  low lying placenta/possible previa .  The risks of cesarean section discussed with the patient included but were not limited to: bleeding which may require transfusion or reoperation; infection which may require antibiotics; injury to bowel, bladder, ureters or other surrounding organs; injury to the fetus; need for additional procedures including hysterectomy in the event of a life-threatening hemorrhage; placental abnormalities wth subsequent pregnancies, incisional problems, thromboembolic phenomenon and other postoperative/anesthesia complications. The patient concurred with the proposed plan, giving informed written consent for the procedure.   ? ?FINDINGS:  Viable female infant in cephalic presentation.  Apgars 8 and 9, weight 2637g.  Clear amniotic fluid.  Intact placenta, three vessel cord.  Normal uterus, fallopian tubes and ovaries bilaterally. ? ?ANESTHESIA:    Spinal ?INTRAVENOUS FLUIDS: 1,000 ml ?ESTIMATED BLOOD LOSS: 550 ml ?URINE OUTPUT: 150 ml ?SPECIMENS: Placenta sent to L&D ?COMPLICATIONS: None immediate ? ?PROCEDURE IN DETAIL:  The patient received intravenous antibiotics and had sequential compression devices applied to her lower extremities while in the preoperative area.  She was then taken to the operating room where spinal anesthesia was administered and was found to be adequate. She was then placed in a dorsal supine position with a leftward tilt, and prepped and draped in a sterile manner.  A foley catheter was placed into her bladder and attached to constant gravity, which  drained clear fluid throughout.  After an adequate timeout was performed, a Pfannenstiel skin incision was made with scalpel and carried through to the underlying layer of fascia. The fascia was incised in the midline and this incision was extended bilaterally bluntly. Kocher clamps were applied to the superior aspect of the fascial incision and the underlying rectus muscles were dissected off bluntly. A similar process was carried out on the inferior aspect of the facial incision. The rectus muscles were separated in the midline bluntly and the peritoneum was entered bluntly. An Alexis retractor was placed to aid in visualization of the uterus.  Attention was turned to the lower uterine segment where a transverse hysterotomy was made with a scalpel and extended bilaterally bluntly. The infant was successfully delivered, and cord was clamped and cut and infant was handed over to awaiting neonatology team.  ? ?Uterine massage was then administered and the placenta delivered intact with three-vessel cord. The uterus was then cleared of clot and debris.  The hysterotomy was closed with 0 Vicryl in a running locked fashion, and an imbricating layer was also placed with a 0 Vicryl.  A few figure of eight stitches were used. Overall, excellent hemostasis was noted. The abdomen and the pelvis were cleared of all clot and debris and the Jon Gills was removed. Hemostasis was confirmed on all surfaces. The fascia was then closed using 0 Vicryl in a running fashion. The subcutaneous layer was reapproximated with plain gut and the skin was closed with 4-0 vicryl. The patient tolerated the procedure well. Sponge, lap, instrument and needle counts were correct x 2. She was taken to the recovery room in stable condition.  ? ? ?Jessica Stack, DO ?07/08/2021 6:55 AM  ?

## 2021-07-08 NOTE — H&P (Signed)
Obstetric Preoperative History and Physical ? ?Jessica Ewing is a 41 y.o. [phone] with IUP at [redacted]w[redacted]d presenting for contractions and vaginal bleeding, in the setting of scheduled c-section later this morning due to known low lying placenta/previa.  ? ?Currently contracting about every 2-3 minutes and feeling stronger since onset.  ? ?Prenatal Course ?Source of Care: [facility] ?Pregnancy complications or risks: ?--Low lying placenta (0.7cm away from internal os)  ?--Language barrier (Khmer)  ?--Advanced maternal age (41 yo)  ? ?Patient Active Problem List  ? Diagnosis Date Noted  ? Low lying placenta nos or without hemorrhage, third trimester 06/22/2021  ? AMA (advanced maternal age) multigravida 35+ 01/19/2021  ? Supervision of high risk pregnancy, antepartum 01/05/2021  ? ?She plans to breastfeed ?She desires Depo-Provera for postpartum contraception.  ? ?Prenatal labs and studies: ?ABO, Rh: --/--/O POS (03/29 5597) ?Antibody: NEG (03/29 0934) ?Rubella: 9.21 (10/12 0958) ?RPR: NON REACTIVE (03/29 0936)  ?HBsAg: Negative (10/12 0958)  ?HIV: Non Reactive (01/16 1132)  ?CBU:LAGTXMIW/-- (03/23 1136) ?2 hr Glucola  passed ?Genetic screening normal ?Anatomy US normal ? ?Prenatal Transfer Tool  ?Maternal Diabetes: No ?Genetic Screening: Normal ?Maternal Ultrasounds/Referrals: Normal ?Fetal Ultrasounds or other Referrals:  None ?Maternal Substance Abuse:  No ?Significant Maternal Medications:  None ?Significant Maternal Lab Results: Group B Strep negative ? ?Past Medical History:  ?Diagnosis Date  ? Low-lying placenta   ? Medical history non-contributory   ? ? ?Past Surgical History:  ?Procedure Laterality Date  ? NO PAST SURGERIES    ? ? ?OB History  ?Gravida Para Term Preterm AB Living  ?4 2 2   1 2   ?SAB IAB Ectopic Multiple Live Births  ?1     0 2  ?  ?# Outcome Date GA Lbr Len/2nd Weight Sex Delivery Anes PTL Lv  ?4 Current           ?3 SAB 08/2020          ?2 Term 12/15/19 [redacted]w[redacted]d 08:55 / 00:35 2906 g F Vag-Spont EPI  LIV   ?   Birth Comments: [phone]  ?1 Term 09/23/09   2722 g F Vag-Spont EPI N LIV  ? ? ?Social History  ? ?Socioeconomic History  ? Marital status: Married  ?  Spouse name: Not on file  ? Number of children: Not on file  ? Years of education: Not on file  ? Highest education level: Not on file  ?Occupational History  ? Not on file  ?Tobacco Use  ? Smoking status: Never  ? Smokeless tobacco: Never  ?Vaping Use  ? Vaping Use: Never used  ?Substance and Sexual Activity  ? Alcohol use: Never  ? Drug use: Never  ? Sexual activity: Yes  ?  Partners: Male  ?  Birth control/protection: None  ?Other Topics Concern  ? Not on file  ?Social History Narrative  ? Not on file  ? ?Social Determinants of Health  ? ?Financial Resource Strain: Not on file  ?Food Insecurity: Not on file  ?Transportation Needs: Not on file  ?Physical Activity: Not on file  ?Stress: Not on file  ?Social Connections: Not on file  ? ? ?Family History  ?Problem Relation Age of Onset  ? Hypertension Mother   ? ? ?Medications Prior to Admission  ?Medication Sig Dispense Refill Last Dose  ? aspirin EC 81 MG tablet Take 1 tablet (81 mg total) by mouth daily. Take after 12 weeks for prevention of preeclampsia later in pregnancy 300 tablet 2 07/07/2021  ?  Prenatal Vit-Fe Phos-FA-Omega (VITAFOL GUMMIES) 3.33-0.333-34.8 MG CHEW Chew 3 tablets by mouth daily.   07/07/2021  ? ? ?No Known Allergies ? ?Review of Systems: Negative except for what is mentioned in HPI. ? ?Physical Exam: ?BP 119/74 (BP Location: Right Arm)   Pulse 85   Temp 97.9 ?F (36.6 ?C) (Oral)   Resp 18   Ht 5\' 3"  (1.6 m)   Wt 62.6 kg   LMP 11/01/2020   BMI 24.43 kg/m?  ?CONSTITUTIONAL: Well-developed, well-nourished female ?HENT:  Normocephalic, atraumatic. Oropharynx is clear and moist ?EYES: Conjunctivae and EOM are normal. No scleral icterus.  ?NECK: Normal range of motion, supple ?SKIN: Skin is warm and dry. No rash noted. No erythema. ?NEUROLGIC: Alert and oriented to person, place, and time.   ?PSYCHIATRIC: Normal mood and affect. Normal behavior. ?CARDIOVASCULAR: Normal heart rate noted ?RESPIRATORY: Effort normal ?ABDOMEN: Soft, nontender, nondistended, gravid.  ?MUSCULOSKELETAL: Normal range of motion.  ? ?Pertinent Labs/Studies:   ?Results for orders placed or performed during the hospital encounter of 07/06/21 (from the past 72 hour(s))  ?Type and screen     Status: None  ? Collection Time: 07/06/21  9:34 AM  ?Result Value Ref Range  ? ABO/RH(D) O POS   ? Antibody Screen NEG   ? Sample Expiration    ?  07/09/2021,2359 ?Performed at [facility], [address], [place], [place] ?  ?CBC     Status: Abnormal  ? Collection Time: 07/06/21  9:36 AM  ?Result Value Ref Range  ? WBC 12.2 (H) 4.0 - 10.5 K/uL  ? RBC 4.27 3.87 - 5.11 MIL/uL  ? Hemoglobin 10.7 (L) 12.0 - 15.0 g/dL  ? HCT 33.5 (L) 36.0 - 46.0 %  ? MCV 78.5 (L) 80.0 - 100.0 fL  ? MCH 25.1 (L) 26.0 - 34.0 pg  ? MCHC 31.9 30.0 - 36.0 g/dL  ? RDW 15.5 11.5 - 15.5 %  ? Platelets 377 150 - 400 K/uL  ? nRBC 0.0 0.0 - 0.2 %  ?  Comment: Performed at [facility], [address], [place], [place]  ?RPR     Status: None  ? Collection Time: 07/06/21  9:36 AM  ?Result Value Ref Range  ? RPR Ser Ql NON REACTIVE NON REACTIVE  ?  Comment: Performed at [facility], [address], [place], [place]  ? ? ?Assessment and Plan :[name] is a 41 y.o. 41 at [redacted]w[redacted]d being admitted for scheduled cesarean section due to low lying placenta/possible previa. Plan for C/S now (rather than 0930) due to painful contractions and vaginal bleeding.   ? ?The risks of cesarean section discussed with the patient included but were not limited to: bleeding which may require transfusion or reoperation; infection which may require antibiotics; injury to bowel, bladder, ureters or other surrounding organs; injury to the fetus; need for additional procedures including hysterectomy in the event of a life-threatening hemorrhage;  placental abnormalities wth subsequent pregnancies, incisional problems, thromboembolic phenomenon and other postoperative/anesthesia complications. The patient concurred with the proposed plan, giving informed written consent for the procedure. NPO since 10pm. Preoperative prophylactic antibiotics and SCDs ordered on call to the OR. To OR when ready.  ? ?#MOF: breastfeeding  ?#MOC: Depo injection  ?#Circ: NA, Female infant.  ? ?[redacted]w[redacted]d, D.O. ?OB Fellow  ?07/08/2021, 5:00 AM  ?

## 2021-07-09 ENCOUNTER — Encounter (HOSPITAL_COMMUNITY): Payer: Self-pay | Admitting: Obstetrics & Gynecology

## 2021-07-09 DIAGNOSIS — Z419 Encounter for procedure for purposes other than remedying health state, unspecified: Secondary | ICD-10-CM | POA: Diagnosis not present

## 2021-07-09 LAB — CBC
HCT: 24.8 % — ABNORMAL LOW (ref 36.0–46.0)
Hemoglobin: 8.4 g/dL — ABNORMAL LOW (ref 12.0–15.0)
MCH: 25.8 pg — ABNORMAL LOW (ref 26.0–34.0)
MCHC: 33.9 g/dL (ref 30.0–36.0)
MCV: 76.3 fL — ABNORMAL LOW (ref 80.0–100.0)
Platelets: 277 10*3/uL (ref 150–400)
RBC: 3.25 MIL/uL — ABNORMAL LOW (ref 3.87–5.11)
RDW: 15.2 % (ref 11.5–15.5)
WBC: 17.5 10*3/uL — ABNORMAL HIGH (ref 4.0–10.5)
nRBC: 0 % (ref 0.0–0.2)

## 2021-07-09 MED ORDER — ALBUTEROL SULFATE (2.5 MG/3ML) 0.083% IN NEBU: 2.5000 mg | INHALATION_SOLUTION | Freq: Once | RESPIRATORY_TRACT | Status: DC | PRN

## 2021-07-09 MED ORDER — SODIUM CHLORIDE 0.9 % IV BOLUS: 500.0000 mL | Freq: Once | INTRAVENOUS | Status: DC | PRN

## 2021-07-09 MED ORDER — METHYLPREDNISOLONE SODIUM SUCC 125 MG IJ SOLR: 125.0000 mg | Freq: Once | INTRAMUSCULAR | Status: DC | PRN

## 2021-07-09 MED ORDER — DIPHENHYDRAMINE HCL 50 MG/ML IJ SOLN: 25.0000 mg | Freq: Once | INTRAMUSCULAR | Status: DC | PRN

## 2021-07-09 MED ORDER — SODIUM CHLORIDE 0.9 % IV SOLN: INTRAVENOUS | Status: DC | PRN

## 2021-07-09 MED ORDER — SODIUM CHLORIDE 0.9 % IV SOLN
500.0000 mg | Freq: Once | INTRAVENOUS | Status: AC
  Administered 2021-07-09: 500 mg via INTRAVENOUS
  Filled 2021-07-09: qty 25

## 2021-07-09 MED ORDER — EPINEPHRINE PF 1 MG/ML IJ SOLN
0.3000 mg | Freq: Once | INTRAMUSCULAR | Status: DC | PRN
  Filled 2021-07-09 (×2): qty 1

## 2021-07-09 NOTE — Progress Notes (Signed)
Subjective: ?Postpartum Day 1: Cesarean Delivery ?Patient reports tolerating PO, + flatus, and no problems voiding.   ? ?Objective: ?Vital signs in last 24 hours: ?Temp:  [97.4 ?F (36.3 ?C)-98.3 ?F (36.8 ?C)] 97.4 ?F (36.3 ?C) (04/01 0446) ?Pulse Rate:  [62-72] 68 (04/01 0446) ?Resp:  [18] 18 (04/01 0446) ?BP: (95-100)/(56-62) 99/62 (04/01 0446) ?SpO2:  [99 %-100 %] 100 % (03/31 2020) ? ?Physical Exam:  ?General: alert, cooperative, and no distress ?Lochia: appropriate ?Uterine Fundus: firm ?Incision: no significant drainage ?DVT Evaluation: No evidence of DVT seen on physical exam. ?Negative Homan's sign. ?No cords or calf tenderness. ?No significant calf/ankle edema. ? ?Recent Labs  ?  07/08/21 ?3474 07/09/21 ?2595  ?HGB 11.2* 8.4*  ?HCT 35.4* 24.8*  ? ? ?Assessment/Plan: ?Status post Cesarean section. Doing well postoperatively.  ?Continue current care. ?Discussed Hgb of 8.4, pt with some lightheadedness when standing so offered IV Venofer with discussion of risks/benefits and pt agreed.  Venofer 500 mg IV infusion ordered. ? ?Sharen Counter ?07/09/2021, 1:25 PM ? ? ?

## 2021-07-10 MED ORDER — OXYCODONE HCL 5 MG PO TABS: 5.0000 mg | ORAL_TABLET | Freq: Four times a day (QID) | ORAL | 0 refills | Status: DC | PRN

## 2021-07-10 MED ORDER — ACETAMINOPHEN 500 MG PO TABS
1000.0000 mg | ORAL_TABLET | Freq: Three times a day (TID) | ORAL | 0 refills | Status: AC | PRN
Start: 2021-07-10 — End: ?

## 2021-07-10 MED ORDER — IBUPROFEN 600 MG PO TABS: 600.0000 mg | ORAL_TABLET | Freq: Four times a day (QID) | ORAL | 0 refills | Status: AC | PRN

## 2021-07-11 ENCOUNTER — Other Ambulatory Visit: Payer: Self-pay | Admitting: Obstetrics and Gynecology

## 2021-07-11 ENCOUNTER — Telehealth: Payer: Self-pay | Admitting: Emergency Medicine

## 2021-07-11 DIAGNOSIS — Z98891 History of uterine scar from previous surgery: Secondary | ICD-10-CM

## 2021-07-11 MED ORDER — OXYCODONE-ACETAMINOPHEN 5-325 MG PO TABS: 1.0000 | ORAL_TABLET | Freq: Four times a day (QID) | ORAL | 0 refills | Status: DC | PRN

## 2021-07-11 NOTE — Telephone Encounter (Signed)
TC to patient in response to husband's voicemail. Pt unable to pick up pain medication from [facility]. This RN confirmed with [facility] that they have been out of oxycodone for about a month.  ?This RN changed pt pharmacy to [facility] and sent message to provider requesting to reorder prescription for patient at this location.  ? ?Pt verbalized understanding that prescription will be sent to alternative pharmacy.  ?

## 2021-07-13 NOTE — Telephone Encounter (Signed)
Transition Care Management Unsuccessful Follow-up Telephone Call ? ?Date of discharge and from where:  07/08/2021-[facility]  ? ?Attempts:  2nd Attempt ? ?Reason for unsuccessful TCM follow-up call:  Left voice message ? ?  ?

## 2021-07-14 ENCOUNTER — Ambulatory Visit: Payer: Medicaid Other | Admitting: *Deleted

## 2021-07-14 VITALS — BP 118/73 | HR 71

## 2021-07-14 DIAGNOSIS — Z98891 History of uterine scar from previous surgery: Secondary | ICD-10-CM

## 2021-07-14 NOTE — Progress Notes (Signed)
Subjective:  ?  ? Jessica Ewing is a 41 y.o. female who presents to the clinic 1 weeks status post low uterine, transverse cesarean section. Pt reports incision is healing well. ? ? ?  ?Objective:  ? ? BP 118/73   Pulse 71   LMP 11/01/2020  ?General:  alert, well appearing, in no apparent distress, oriented to person, place and time, well hydrated  ?Incision:   healing well, no drainage, no erythema, no hernia, no seroma, no swelling, well approximated, steri strips intact with small amount of old drainage, no dehiscence, incision well approximated  ?   ?Assessment:  ? ? Doing well postoperatively. ?  ?Plan:  ? ? 1. Continue any current medications. ?2. Wound care discussed. ?3. Follow up: 08/23/21.  ? ?Penny Pia, RN  ?

## 2021-07-14 NOTE — Progress Notes (Signed)
Pt reports doing well over all. Feeding infant with combination of pumped breast milk and formula. Reports only getting 2 oz of breast milk with each pumping. Encouraged more frequent pumping or putting baby to breast to increase milk supply. Reports some swelling after delivery but says that it is getting better. Trace edema noted. Encouraged continued increase in water intake. Denies symptoms of depression or anxiety at this time. ?

## 2021-08-08 DIAGNOSIS — Z419 Encounter for procedure for purposes other than remedying health state, unspecified: Secondary | ICD-10-CM | POA: Diagnosis not present

## 2021-08-23 ENCOUNTER — Ambulatory Visit (INDEPENDENT_AMBULATORY_CARE_PROVIDER_SITE_OTHER): Payer: Medicaid Other | Admitting: Obstetrics & Gynecology

## 2021-08-23 DIAGNOSIS — Z30013 Encounter for initial prescription of injectable contraceptive: Secondary | ICD-10-CM | POA: Diagnosis not present

## 2021-08-23 LAB — POCT URINE PREGNANCY: Preg Test, Ur: NEGATIVE

## 2021-08-23 MED ORDER — MEDROXYPROGESTERONE ACETATE 150 MG/ML IM SUSP: 150.0000 mg | INTRAMUSCULAR | 4 refills | Status: AC

## 2021-08-23 MED ORDER — MEDROXYPROGESTERONE ACETATE 150 MG/ML IM SUSP
150.0000 mg | Freq: Once | INTRAMUSCULAR | Status: AC
  Administered 2021-08-23: 150 mg via INTRAMUSCULAR

## 2021-08-23 NOTE — Progress Notes (Signed)
Office supply Depo given today in Right Deltoid. ?Pt tolerated well.  ?Pt advised to pick up Depo from pharmacy and schedule next appt.  ? ? ?Administrations This Visit   ? ? medroxyPROGESTERone (DEPO-PROVERA) injection 150 mg   ? ? Admin Date ?08/23/2021 Action ?Given Dose ?150 mg Route ?Intramuscular Administered By ?Marya Landry D, CMA  ? ?  ?  ? ?  ? ? ?

## 2021-08-23 NOTE — Progress Notes (Signed)
? ? ?  Post Partum Visit Note ? ?Jessica Ewing is a 41 y.o. [phone] female who presents for a postpartum visit. She is 6 weeks postpartum following a primary cesarean section.  I have fully reviewed the prenatal and intrapartum course. The delivery was at 37.1 gestational weeks.  Anesthesia: spinal. Postpartum course has been good. Baby is doing well. Baby is feeding by bottle - Fawn Kirk . Bleeding no bleeding. Bowel function is normal. Bladder function is normal. Patient is not sexually active. Contraception method is abstinence.   Pt is interested in Depo.  ? ?Postpartum depression screening: negative, score 1. ? ? ?The pregnancy intention screening data noted above was reviewed. Potential methods of contraception were discussed. The patient elected to proceed with No data recorded. ? ? ? ?There are no preventive care reminders to display for this patient. ? ?The following portions of the patient's history were reviewed and updated as appropriate: allergies, current medications, past family history, past medical history, past social history, past surgical history, and problem list. ? ?Review of Systems ?Pertinent items are noted in HPI. ? ?Objective:  ?LMP 11/01/2020   ? ?General:  alert, cooperative, and no distress  ? Breasts:  not indicated  ?Lungs:   ?Heart:  regular rate and rhythm  ?Abdomen: soft, non-tender; bowel sounds normal; no masses,  no organomegaly   ?Wound well approximated incision  ?GU exam:  normal  ?     ?Assessment:  ? ? There are no diagnoses linked to this encounter. ? ?normal postpartum exam.  ? ?Plan:  ? ?Essential components of care per ACOG recommendations: ? ?1.  Mood and well being: Patient with negative depression screening today. Reviewed local resources for support.  ?- Patient tobacco use? No.   ?- hx of drug use? No.   ? ?2. Infant care and feeding:  ?-Patient currently breastmilk feeding? No.  ?-Social determinants of health (SDOH) reviewed in EPIC. No concerns ? ?3.  Sexuality, contraception and birth spacing ?- Patient does not want a pregnancy in the next year.  Desired family size is 3 children.  ?- Reviewed reproductive life planning. Reviewed contraceptive methods based on pt preferences and effectiveness.  Patient desired Hormonal Injection today.   ?- Discussed birth spacing of 18 months ? ?4. Sleep and fatigue ?-Encouraged family/partner/community support of 4 hrs of uninterrupted sleep to help with mood and fatigue ? ?5. Physical Recovery  ?- Discussed patients delivery and complications. She describes her labor as good. ?- Patient had a C-section. P ?- Patient has urinary incontinence? No. ?- Patient is safe to resume physical and sexual activity ? ?6.  Health Maintenance ?- HM due items addressed Yes ?- Last pap smear  ?Diagnosis  ?Date Value Ref Range Status  ?07/07/2019   Final  ? - Negative for intraepithelial lesion or malignancy (NILM)  ? Pap smear not done at today's visit.  ?-Breast Cancer screening indicated? Yes. Patient referred today for mammogram.  ? ?7. Chronic Disease/Pregnancy Condition follow up: None ? ?- PCP follow up ? ?Woodroe Mode, MD ?[facility] [place] [place] ? ?

## 2021-09-08 DIAGNOSIS — Z419 Encounter for procedure for purposes other than remedying health state, unspecified: Secondary | ICD-10-CM | POA: Diagnosis not present

## 2021-10-08 DIAGNOSIS — Z419 Encounter for procedure for purposes other than remedying health state, unspecified: Secondary | ICD-10-CM | POA: Diagnosis not present

## 2021-11-08 DIAGNOSIS — Z419 Encounter for procedure for purposes other than remedying health state, unspecified: Secondary | ICD-10-CM | POA: Diagnosis not present

## 2021-11-11 ENCOUNTER — Ambulatory Visit (INDEPENDENT_AMBULATORY_CARE_PROVIDER_SITE_OTHER): Payer: Medicaid Other

## 2021-11-11 VITALS — BP 117/80 | HR 90 | Wt 126.0 lb

## 2021-11-11 DIAGNOSIS — Z3042 Encounter for surveillance of injectable contraceptive: Secondary | ICD-10-CM

## 2021-11-11 MED ORDER — MEDROXYPROGESTERONE ACETATE 150 MG/ML IM SUSP
150.0000 mg | Freq: Once | INTRAMUSCULAR | Status: AC
  Administered 2021-11-11: 150 mg via INTRAMUSCULAR

## 2021-11-11 NOTE — Progress Notes (Signed)
Subjective:  Pt in for Depo Provera injection.    Objective: Need for contraception. No unusual complaints.    Assessment: Depo given L Del without difficulty   Plan:  Pt requests to change to a long term birth control: Nexplanon.  Administrations This Visit     medroxyPROGESTERone (DEPO-PROVERA) injection 150 mg     Admin Date 11/11/2021 Action Given Dose 150 mg Route Intramuscular Administered By Lewayne Bunting, CMA

## 2021-11-12 ENCOUNTER — Other Ambulatory Visit: Payer: Self-pay | Admitting: Obstetrics and Gynecology

## 2021-11-12 DIAGNOSIS — N912 Amenorrhea, unspecified: Secondary | ICD-10-CM

## 2021-11-20 ENCOUNTER — Other Ambulatory Visit: Payer: Self-pay | Admitting: Obstetrics and Gynecology

## 2021-11-20 IMAGING — US US OB TRANSVAGINAL
1 series · 15 of 28 positions shown · non-contrast
Comparison: Outpatient obstetrical ultrasound 08/26/2020

CLINICAL DATA: Bleeding for 1 day, quantitative beta hCG 23,480

EXAM:
TRANSVAGINAL OB ULTRASOUND
TECHNIQUE: Transvaginal ultrasound was performed for complete evaluation of the
gestation as well as the maternal uterus, adnexal regions, and
pelvic cul-de-sac.

[Series 1: us ob transvaginal · 15 of 40 slices shown]
[im 1/40]
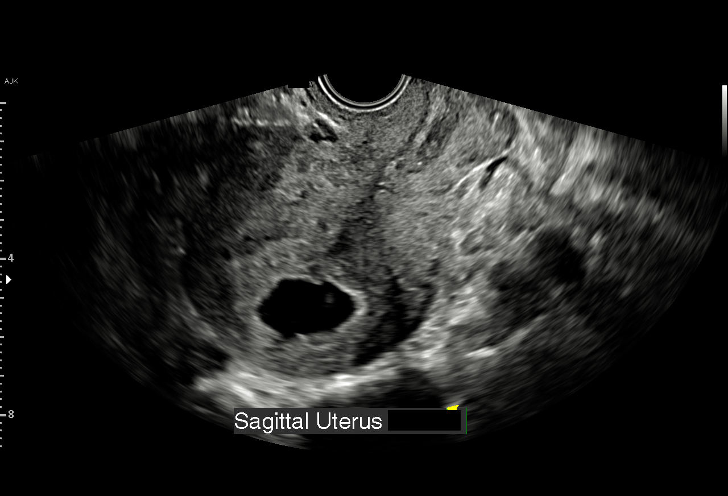
[im 3/40]
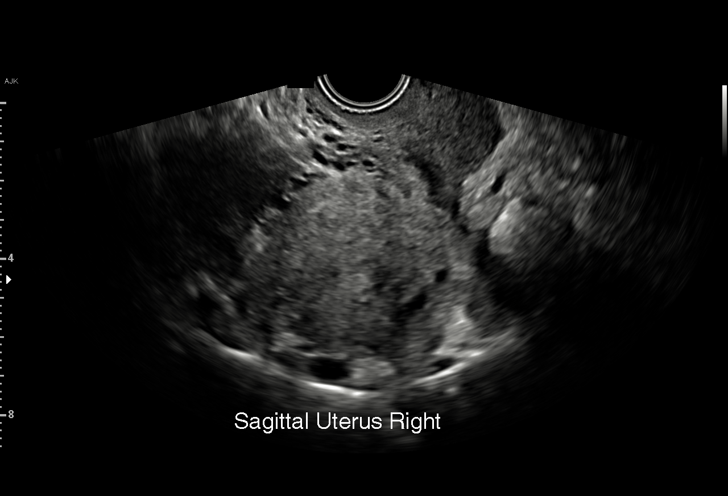
[im 6/40]
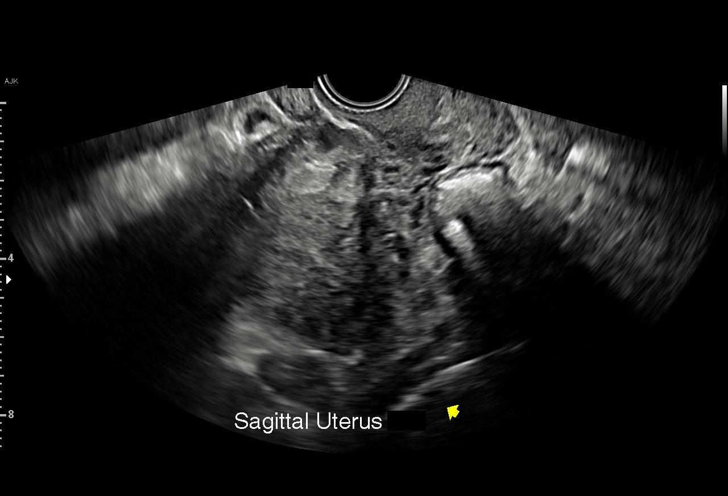
[im 9/40]
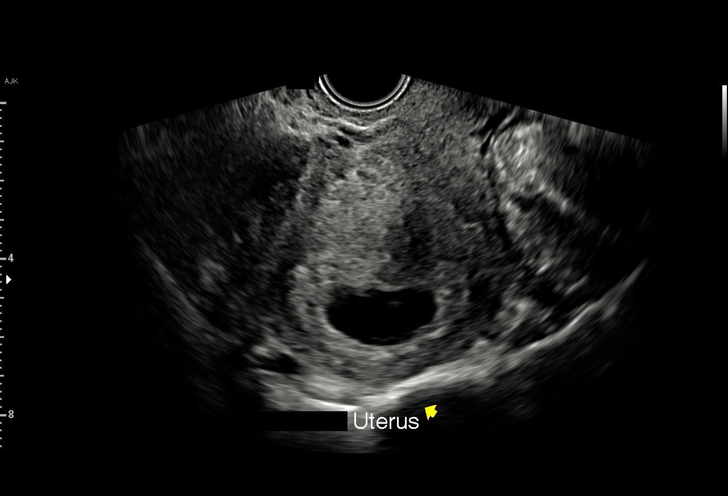
[im 12/40]
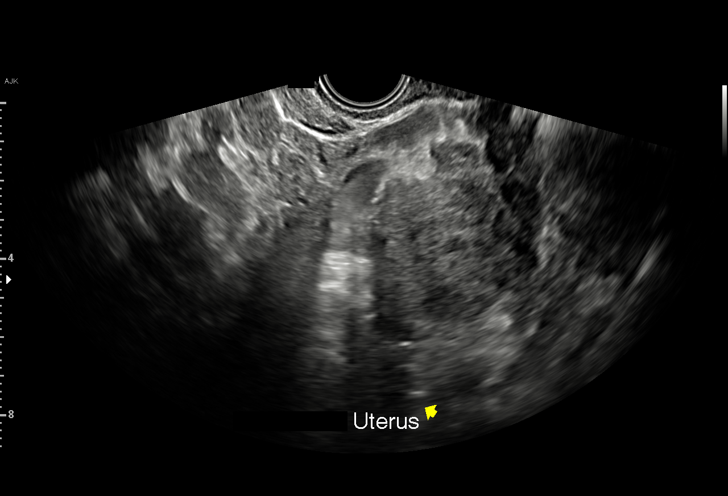
[im 15/40]
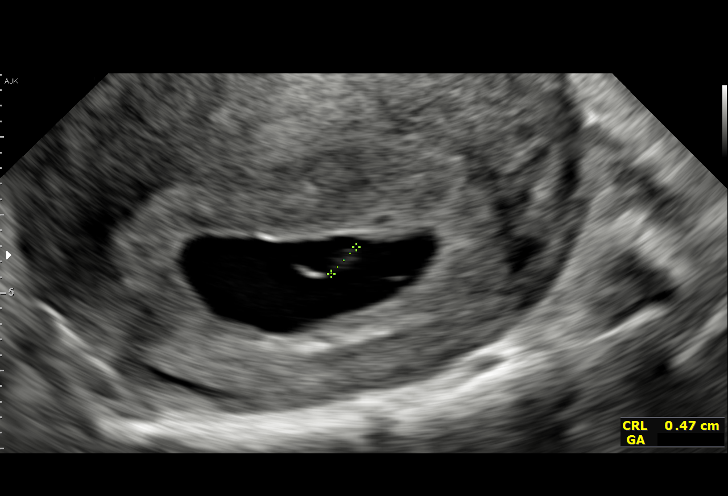
[im 18/40]
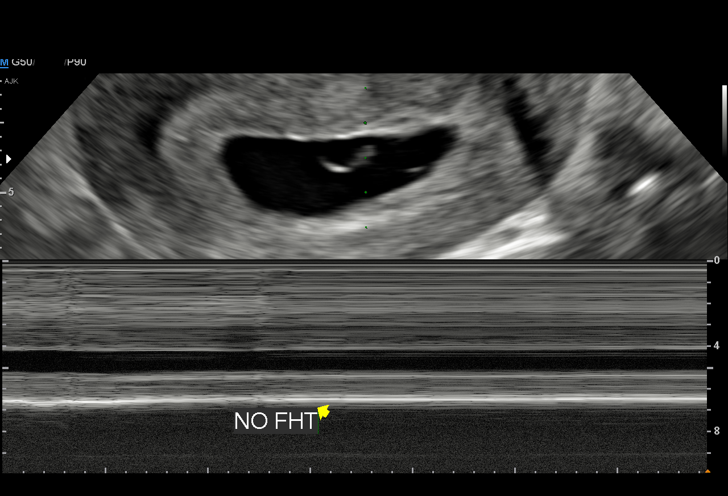
[im 21/40]
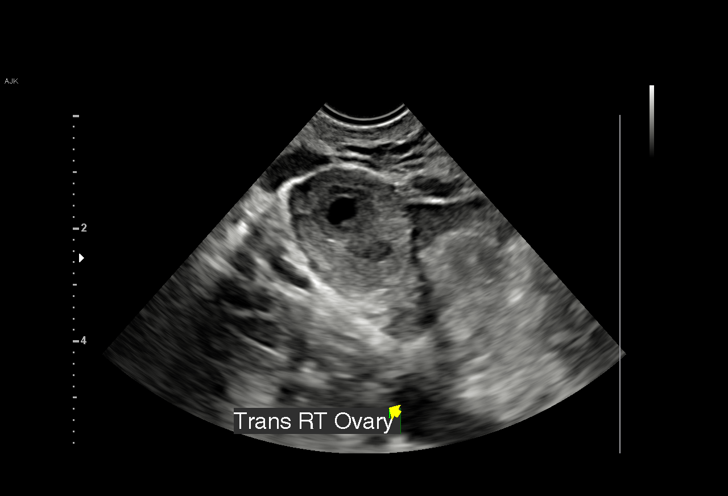
[im 22/40]
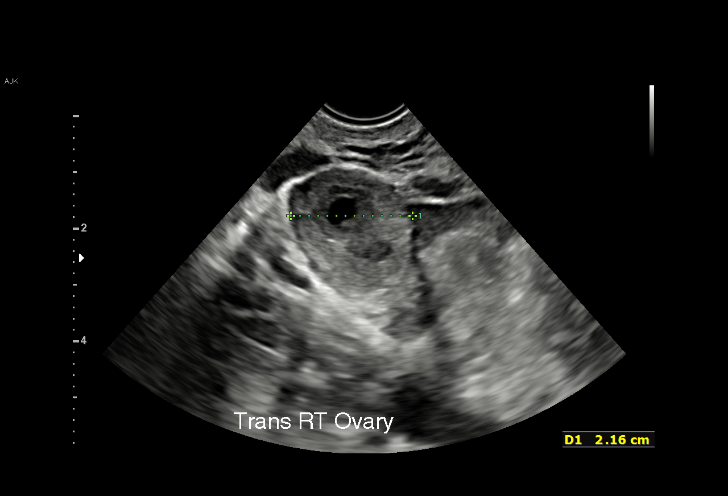
[im 25/40]
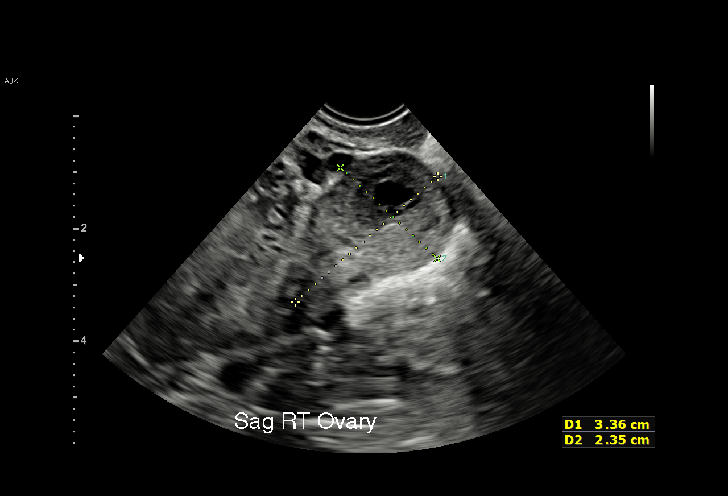
[im 28/40]
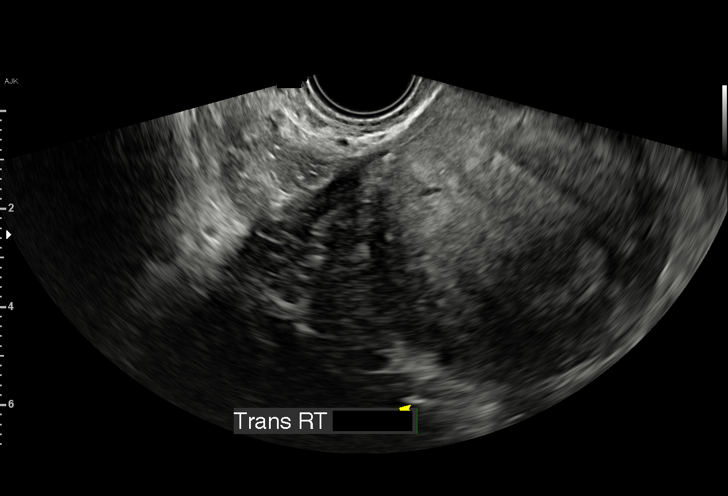
[im 31/40]
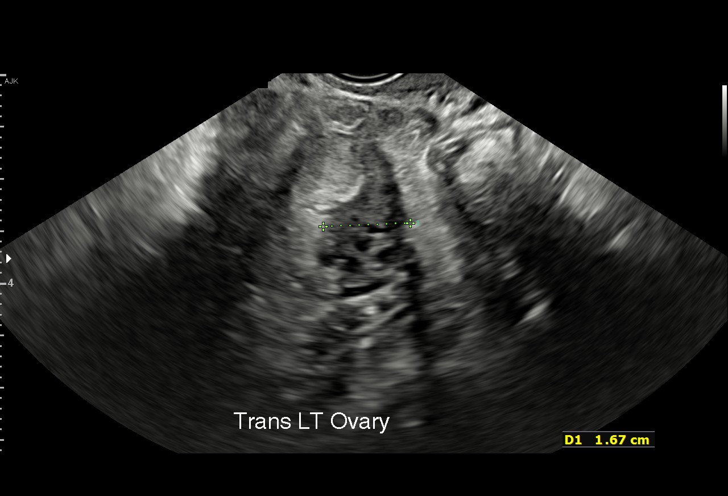
[im 34/40]
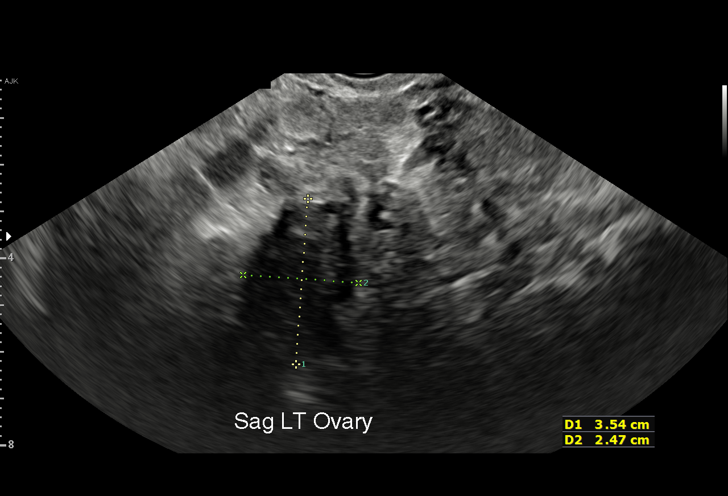
[im 37/40]
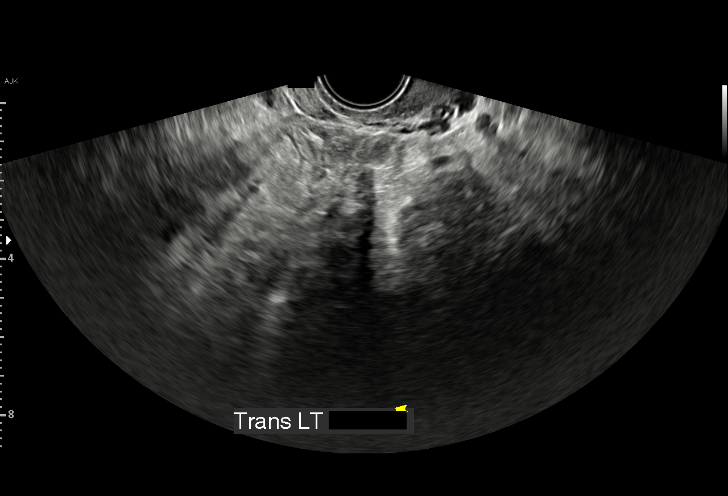
[im 40/40]
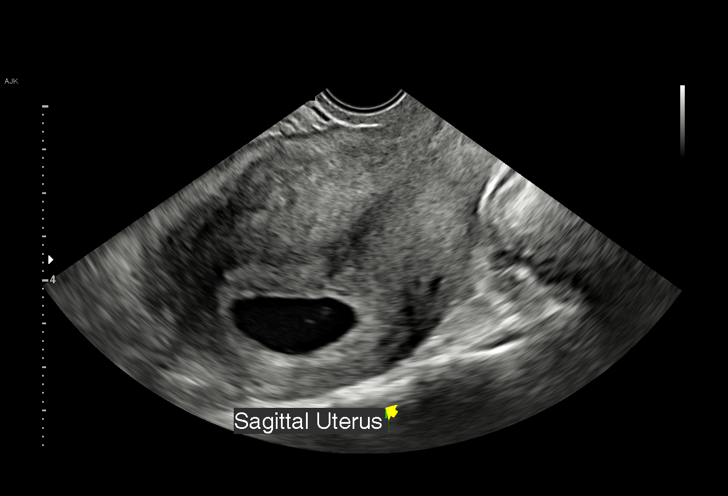

[15 of 28 positions shown; findings below may reference images not displayed]

FINDINGS: Intrauterine gestational sac: Single

Yolk sac:  Visualized.

Embryo:  Visualized.

Cardiac Activity: Not Visualized.

Heart Rate:  bpm

CRL:   4.1 mm   6 w 1 d                  US EDC: 04/26/2021

Subchorionic hemorrhage:  None visualized.

Maternal uterus/adnexae: Anteverted maternal uterus. No concerning
adnexal lesions. No pelvic free fluid.
IMPRESSION: Intrauterine gestational sac with discernible fetal pole. At
approximately 6 weeks 1 day by crown-rump length sonographic
estimation.

No detectable fetal cardiac activity. Of note, prior sonography
performed 08/26/2020 demonstrates a an early fetal pole without
cardiac activity with minimal progression in fetal size from the
comparison exam. Due to the diminutive size of the fetal pole
(<7mm), findings remain suspicious but not yet definitive for failed
pregnancy. Recommend follow-up US in 10-14 days for definitive
diagnosis. This recommendation follows SRU consensus guidelines:
Diagnostic Criteria for Nonviable Pregnancy Early in the First
Trimester. N Engl J Med 8529; [DATE].

## 2021-12-09 DIAGNOSIS — Z419 Encounter for procedure for purposes other than remedying health state, unspecified: Secondary | ICD-10-CM | POA: Diagnosis not present

## 2022-01-08 DIAGNOSIS — Z419 Encounter for procedure for purposes other than remedying health state, unspecified: Secondary | ICD-10-CM | POA: Diagnosis not present

## 2022-02-07 ENCOUNTER — Ambulatory Visit (INDEPENDENT_AMBULATORY_CARE_PROVIDER_SITE_OTHER): Payer: Medicaid Other | Admitting: Obstetrics and Gynecology

## 2022-02-07 ENCOUNTER — Encounter: Payer: Self-pay | Admitting: Obstetrics and Gynecology

## 2022-02-07 ENCOUNTER — Ambulatory Visit: Payer: Medicaid Other | Admitting: Family Medicine

## 2022-02-07 VITALS — BP 136/80 | HR 74 | Wt 125.4 lb

## 2022-02-07 DIAGNOSIS — Z30430 Encounter for insertion of intrauterine contraceptive device: Secondary | ICD-10-CM | POA: Diagnosis not present

## 2022-02-07 DIAGNOSIS — Z01812 Encounter for preprocedural laboratory examination: Secondary | ICD-10-CM | POA: Diagnosis not present

## 2022-02-07 DIAGNOSIS — Z1152 Encounter for screening for COVID-19: Secondary | ICD-10-CM | POA: Diagnosis not present

## 2022-02-07 MED ORDER — PARAGARD INTRAUTERINE COPPER IU IUD
1.0000 | INTRAUTERINE_SYSTEM | Freq: Once | INTRAUTERINE | Status: AC
  Administered 2022-02-07: 1 via INTRAUTERINE

## 2022-02-07 NOTE — Progress Notes (Signed)
Patient presents for IUD insertion. Patient states that she wants to have her period every month. Last intercourse  4-5 days ago. Last depo 11/11/21. Patient has no concerns today.

## 2022-02-07 NOTE — Addendum Note (Signed)
Addended by: Lewie Loron D on: 02/07/2022 02:47 PM   Modules accepted: Orders

## 2022-02-07 NOTE — Progress Notes (Signed)
41 yo P3 here for IUD insertion. Patient is without any other complaints  Past Medical History:  Diagnosis Date   Low-lying placenta    Medical history non-contributory    Past Surgical History:  Procedure Laterality Date   CESAREAN SECTION N/A 07/08/2021   Procedure: CESAREAN SECTION;  Surgeon: Janyth Pupa, DO;  Location: [facility];  Service: Obstetrics;  Laterality: N/A;   NO PAST SURGERIES     Family History  Problem Relation Age of Onset   Hypertension Mother    Social History   Tobacco Use   Smoking status: Never    Passive exposure: Never   Smokeless tobacco: Never  Vaping Use   Vaping Use: Never used  Substance Use Topics   Alcohol use: Never   Drug use: Never   ROS See pertinent in HPI. All other systems reviewed and non contributory Blood pressure 136/80, pulse 74, weight 125 lb 6.4 oz (56.9 kg), not currently breastfeeding. GENERAL: Well-developed, well-nourished female in no acute distress.  ABDOMEN: Soft, nontender, nondistended. No organomegaly. PELVIC: Normal external female genitalia. Vagina is pink and rugated.  Normal discharge. Normal appearing cervix. Uterus is normal in size. No adnexal mass or tenderness. Chaperone present during the pelvic exam EXTREMITIES: No cyanosis, clubbing, or edema, 2+ distal pulses.  A/P 41 yo here for Paraguard IUD insertion IUD Procedure Note Patient identified, informed consent performed, signed copy in chart, time out was performed.  Urine pregnancy test negative.  Speculum placed in the vagina.  Cervix visualized.  Cleaned with Betadine x 2.  Grasped anteriorly with a single tooth tenaculum.  Uterus sounded to 8 cm.  Paraguard IUD placed per manufacturer's recommendations.  Strings trimmed to 3 cm. Tenaculum was removed, good hemostasis noted.  Patient tolerated procedure well.   Patient given post procedure instructions and Paraguard care card with expiration date.  Patient is asked to check IUD strings periodically and  follow up in 4-6 weeks for IUD check.

## 2022-02-08 DIAGNOSIS — Z419 Encounter for procedure for purposes other than remedying health state, unspecified: Secondary | ICD-10-CM | POA: Diagnosis not present

## 2022-03-10 ENCOUNTER — Encounter: Payer: Self-pay | Admitting: Certified Nurse Midwife

## 2022-03-10 ENCOUNTER — Ambulatory Visit (INDEPENDENT_AMBULATORY_CARE_PROVIDER_SITE_OTHER): Payer: Medicaid Other | Admitting: Certified Nurse Midwife

## 2022-03-10 VITALS — BP 120/79 | HR 76 | Wt 128.2 lb

## 2022-03-10 DIAGNOSIS — Z789 Other specified health status: Secondary | ICD-10-CM | POA: Diagnosis not present

## 2022-03-10 DIAGNOSIS — N923 Ovulation bleeding: Secondary | ICD-10-CM

## 2022-03-10 DIAGNOSIS — N920 Excessive and frequent menstruation with regular cycle: Secondary | ICD-10-CM | POA: Insufficient documentation

## 2022-03-10 DIAGNOSIS — Z30431 Encounter for routine checking of intrauterine contraceptive device: Secondary | ICD-10-CM | POA: Diagnosis not present

## 2022-03-10 DIAGNOSIS — Z419 Encounter for procedure for purposes other than remedying health state, unspecified: Secondary | ICD-10-CM | POA: Diagnosis not present

## 2022-03-10 NOTE — Progress Notes (Signed)
Patient presents for string check. Pt reports having some spotting with her IUD, but is having no other issues.

## 2022-03-10 NOTE — Progress Notes (Signed)
GYNECOLOGY OFFICE VISIT NOTE  HistoryPavneet Ewing, 41 y.o. [phone] here today for IUD string check. Copper IUD was placed on 02/07/22 by P. Constant, MD. She denies any abnormal vaginal discharge, or pelvic pain. She noted Having heavier periods lasting longer and spotting between cycles.  No dyspareunia.   Past Medical History:  Diagnosis Date   Low-lying placenta    Medical history non-contributory     Past Surgical History:  Procedure Laterality Date   CESAREAN SECTION N/A 07/08/2021   Procedure: CESAREAN SECTION;  Surgeon: Myna Hidalgo, DO;  Location: [facility];  Service: Obstetrics;  Laterality: N/A;   NO PAST SURGERIES      The following portions of the patient's history were reviewed and updated as appropriate: allergies, current medications, past family history, past medical history, past social history, past surgical history and problem list.   Review of Systems:  Pertinent items noted in HPI and remainder of comprehensive ROS otherwise negative.  Objective:  Physical Exam BP 120/79   Pulse 76   Wt 128 lb 3.2 oz (58.2 kg)   BMI 22.71 kg/m  CONSTITUTIONAL: Well-developed, well-nourished female in no acute distress.  HENT:  Normocephalic, atraumatic.  SKIN: Skin is warm and dry. No rash noted. Not diaphoretic. No erythema. No pallor. NEUROLOGIC: Alert and oriented to person, place, and time.  PSYCHIATRIC: Normal mood and affect. Normal behavior. Normal judgment and thought content. CARDIOVASCULAR: Normal heart rate noted RESPIRATORY: Effort and rate normal ABDOMEN: Soft, no distention noted.   PELVIC: Normal appearing external genitalia; normal appearing vaginal mucosa and cervix. IUD string seen. No abnormal discharge noted.    Assessment & Plan:  1. IUD check up 3. Spotting between Menses  - Strings observed. No need to trim. No problems with intercourse.  - Reviewed that spotting between cycles post IUD placement is normal for 3-6 months after placement,  especially with Copper IUD insertion.  - Initially patient was discussing to be placed on pill pack to stop the spotting between cycles. Discussed that doing so would involve placing patient on hormonal BC and she declined.  - Recommended that patient give IUD at least 3-6 months to regulate and if still spotting between cycles, make an appointment. Reviewed normalcy of spotting and heavier bleeding with copper IUD vs hormonal IUD.  - Patient verbalized understanding.   2. Language barrier - Due to language barrier, an interpreter was present during the history-taking and subsequent discussion (and for part of the physical exam) with this patient. - In-person "Khmer" interpreter present for duration of the visit.   Please refer to After Visit Summary for other counseling recommendations.   Return in about 1 year (around 03/11/2023) for [place].   Carlynn Herald, CNM 03/10/2022 10:52 AM

## 2022-04-10 DIAGNOSIS — Z419 Encounter for procedure for purposes other than remedying health state, unspecified: Secondary | ICD-10-CM | POA: Diagnosis not present

## 2022-05-11 DIAGNOSIS — Z419 Encounter for procedure for purposes other than remedying health state, unspecified: Secondary | ICD-10-CM | POA: Diagnosis not present

## 2022-06-09 DIAGNOSIS — Z419 Encounter for procedure for purposes other than remedying health state, unspecified: Secondary | ICD-10-CM | POA: Diagnosis not present

## 2022-07-10 DIAGNOSIS — Z419 Encounter for procedure for purposes other than remedying health state, unspecified: Secondary | ICD-10-CM | POA: Diagnosis not present

## 2022-08-01 IMAGING — US US MFM OB FOLLOW-UP
1 series · 13 of 28 positions shown · non-contrast
Comparison: none

[Series 1: us mfm ob follow-up · 64 acquisitions, 13 frames shown]
[im 3/64]
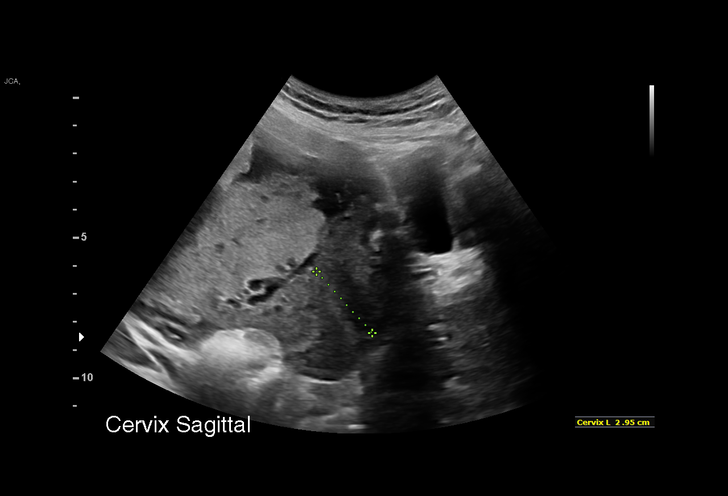
[im 8/64]
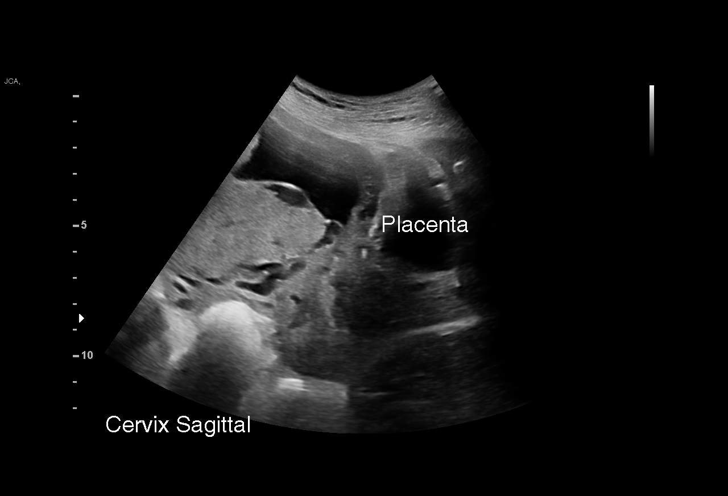
[im 12/64]
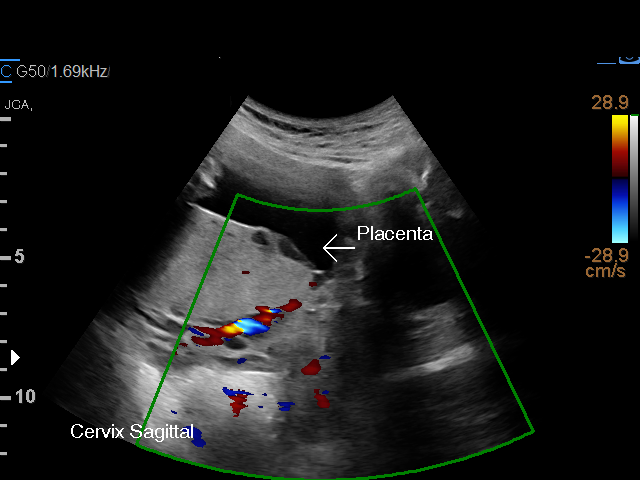
[im 17/64]
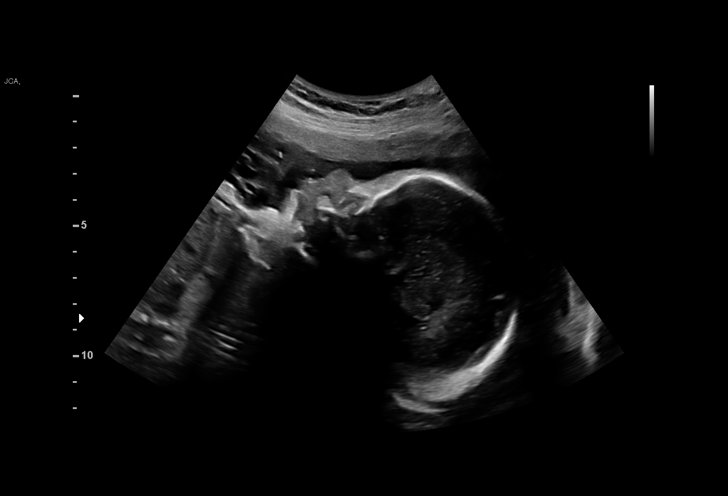
[im 22/64]
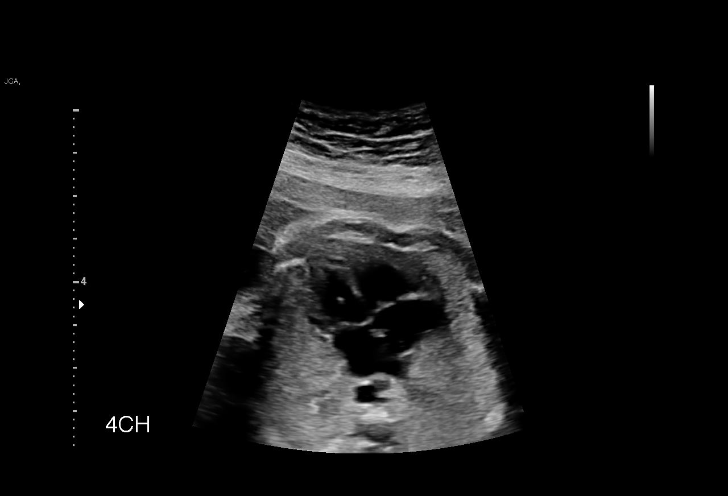
[im 26/64]
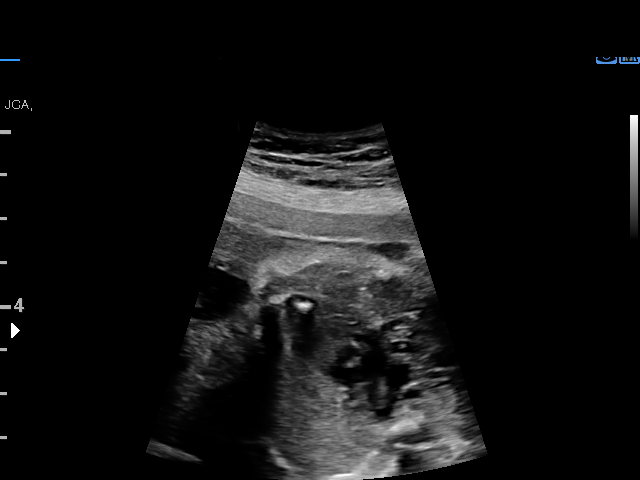
[im 33/64]
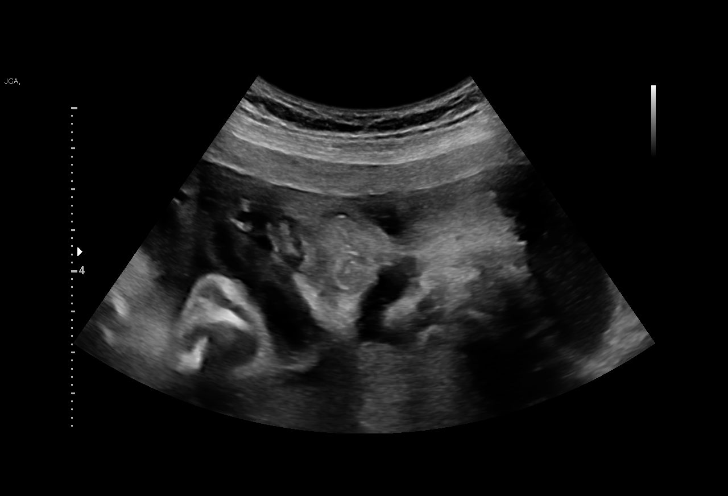
[im 38/64]
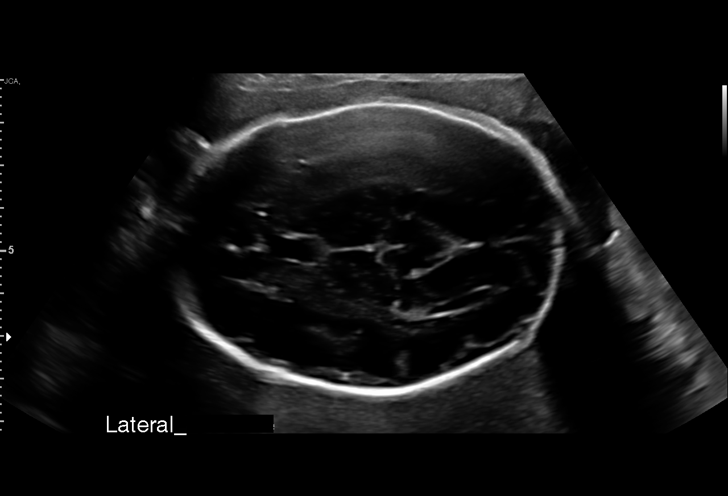
[im 43/64]
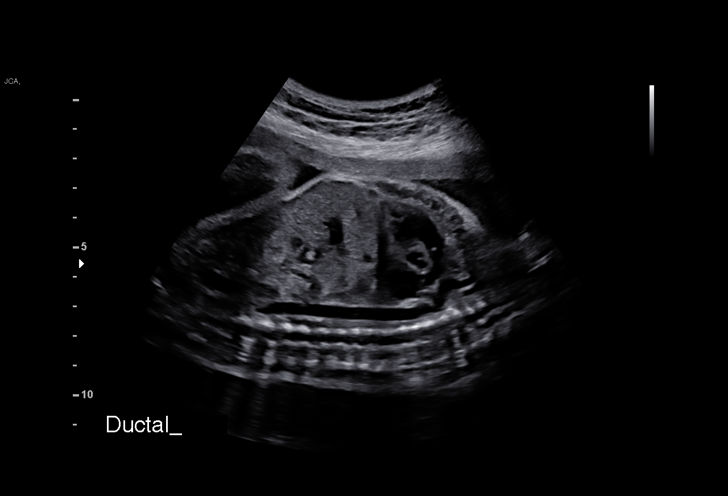
[im 47/64]
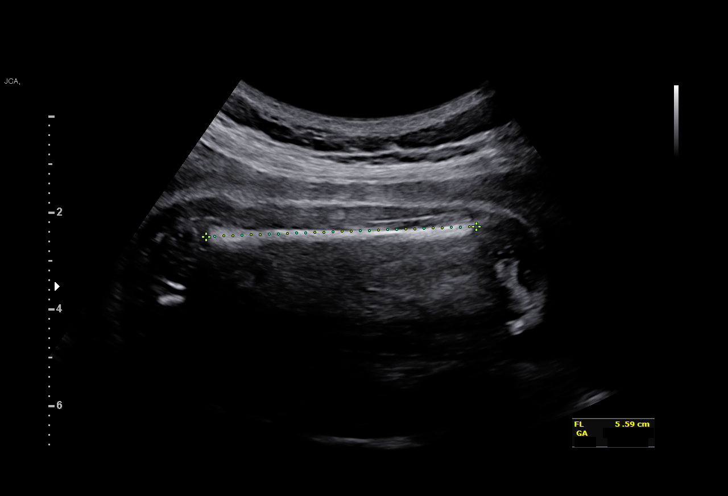
[im 52/64]
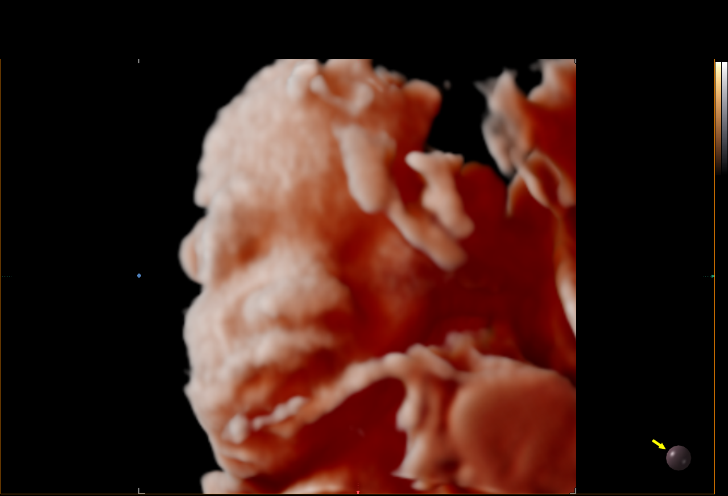
[im 57/64]
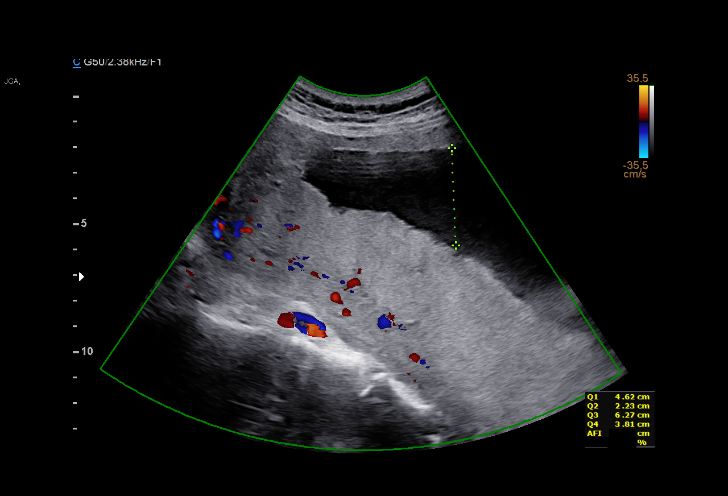
[im 61/64]
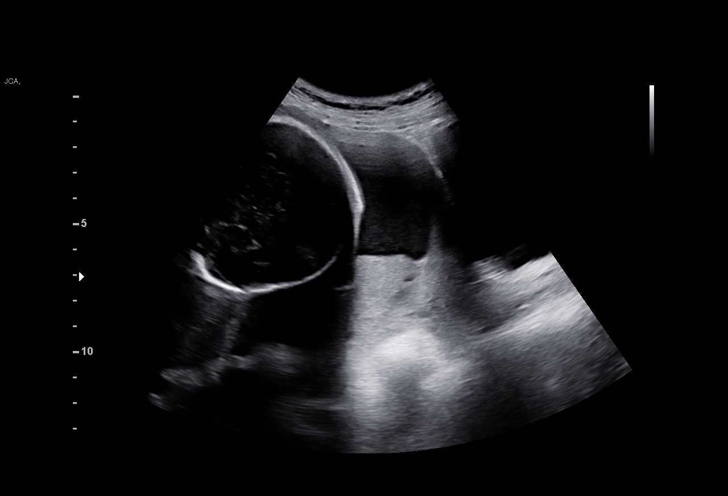

[13 of 28 positions shown; findings below may reference images not displayed]

[REDACTED]
                   [id number]

                                                      OOMMEN

Indications

 Advanced maternal age multigravida 35+,
 second trimester (40)
 Placenta previa specified as without
 hemorrhage, second trimester
 29 weeks gestation of pregnancy
 Encounter for other antenatal screening
 follow-up
 LR NIPS/Negative AFP/Negative Horizon
Fetal Evaluation

 Num Of Fetuses:         1
 Fetal Heart Rate(bpm):  143
 Cardiac Activity:       Observed
 Presentation:           Cephalic
 Placenta:               Posterior Previa
 P. Cord Insertion:      Previously Visualized

 Amniotic Fluid
 AFI FV:      Within normal limits

 AFI Sum(cm)     %Tile       Largest Pocket(cm)
 16.93           63
 RUQ(cm)       RLQ(cm)       LUQ(cm)        LLQ(cm)

Biometry

 BPD:      68.3  mm     G. Age:  27w 3d        3.9  %    CI:        66.45   %    70 - 86
                                                         FL/HC:      20.6   %    19.6 -
 HC:      268.6  mm     G. Age:  29w 2d         21  %    HC/AC:      1.09        0.99 -
 AC:      246.2  mm     G. Age:  28w 6d         35  %    FL/BPD:     81.0   %    71 - 87
 FL:       55.3  mm     G. Age:  29w 1d         35  %    FL/AC:      22.5   %    20 - 24
 LV:        2.5  mm

 Est. FW:    4638  gm    2 lb 14 oz      28  %
OB History

 Blood Type:   O+
 Gravidity:    4         Term:   2         SAB:   1
 Living:       2
Gestational Age

 LMP:           27w 4d        Date:  11/01/20                 EDD:   08/08/21
 U/S Today:     28w 5d                                        EDD:   07/31/21
 Best:          29w 1d     Det. By:  U/S C R L  (01/05/21)    EDD:   07/28/21
Anatomy

 Cranium:               Appears normal         Aortic Arch:            Appears normal
 Cavum:                 Previously seen        Ductal Arch:            Appears normal
 Ventricles:            Appears normal         Diaphragm:              Appears normal
 Choroid Plexus:        Previously seen        Stomach:                Appears normal, left
                                                                       sided
 Cerebellum:            Previously seen        Abdomen:                Previously seen
 Posterior Fossa:       Previously seen        Abdominal Wall:         Previously seen
 Nuchal Fold:           Not applicable (>20    Cord Vessels:           Previously seen
                        wks GA)
 Face:                  Orbits and profile     Kidneys:                Appear normal
                        previously seen
 Lips:                  Previously seen        Bladder:                Appears normal
 Thoracic:              Appears normal         Spine:                  Previously seen
 Heart:                 Appears normal; EIF    Upper Extremities:      Previously seen
 RVOT:                  Appears normal         Lower Extremities:      Previously seen
 LVOT:                  Appears normal

 Other:  Female gender previously seen. Nasal bone, Lenses, VC, Heels/feet
         and open hands/5th digits visualized previously.
Cervix Uterus Adnexa

 Cervix
 Length:           2.78  cm.
 Normal appearance by transabdominal scan.
Comments

 This patient was seen for a follow up growth scan due to
 advanced maternal age and a low-lying placenta/placenta
 previa noted on her prior exams.  She denies any problems
 since her last exam.
 She was informed that the fetal growth and amniotic fluid
 level appears appropriate for her gestational age.
 A placenta previa continues to be noted on today's exam.
 The patient was reassured that most cases of placenta previa
 will resolve later in her pregnancy.
 A follow up exam was scheduled in 5 weeks.  We will perform
 a transvaginal ultrasound at her next exam to assess the
 placental location.
 The patient understands that a cesarean delivery will be
 recommended at around 37 weeks should a placenta previa
 persist at her next exam.

## 2022-08-09 DIAGNOSIS — Z419 Encounter for procedure for purposes other than remedying health state, unspecified: Secondary | ICD-10-CM | POA: Diagnosis not present

## 2022-09-09 DIAGNOSIS — Z419 Encounter for procedure for purposes other than remedying health state, unspecified: Secondary | ICD-10-CM | POA: Diagnosis not present

## 2022-09-24 IMAGING — US US MFM FETAL BPP W/O NON-STRESS
1 series · 15 of 28 positions shown · non-contrast
Comparison: none

[Series 1: us mfm fetal bpp w/o non-stress · 32 acquisitions, 15 frames shown]
[im 1/32]
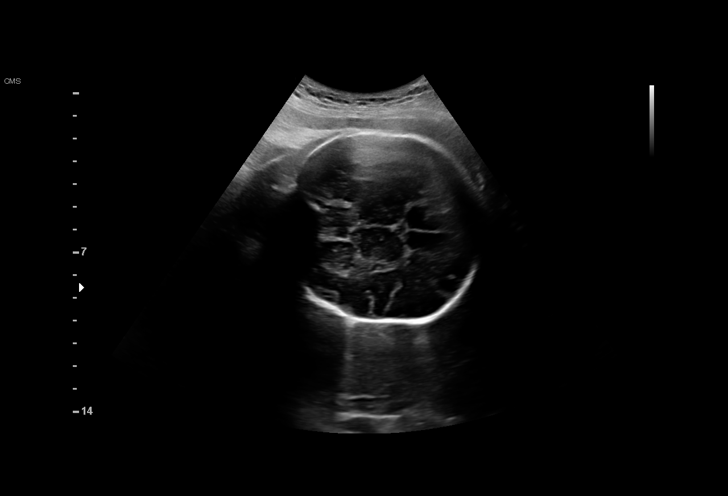
[im 3/32]
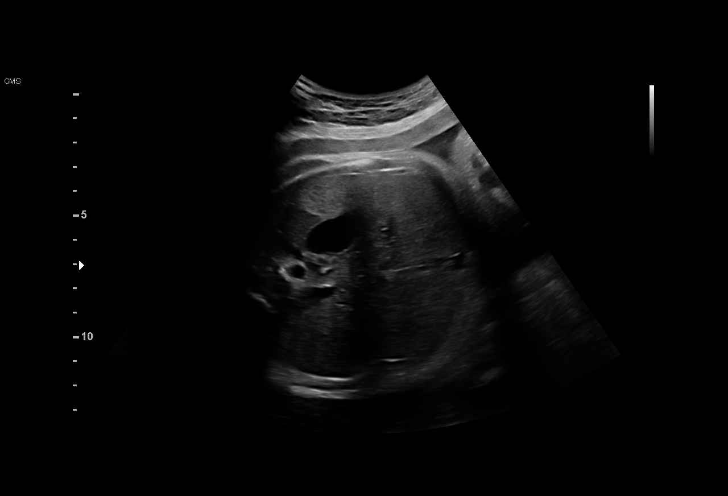
[im 5/32]
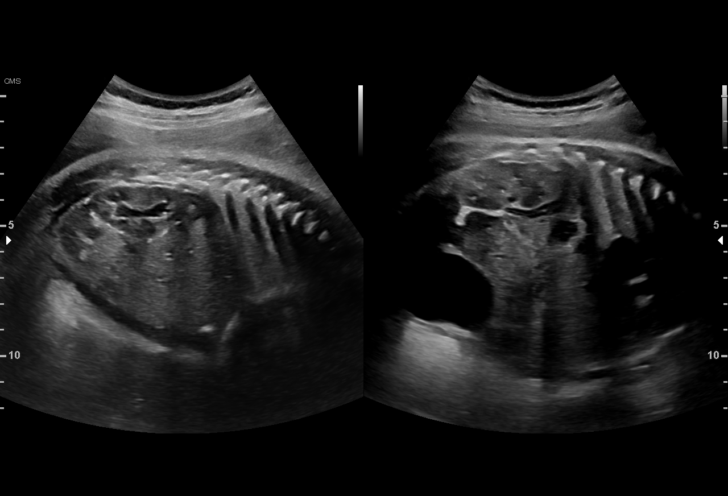
[im 7/32]
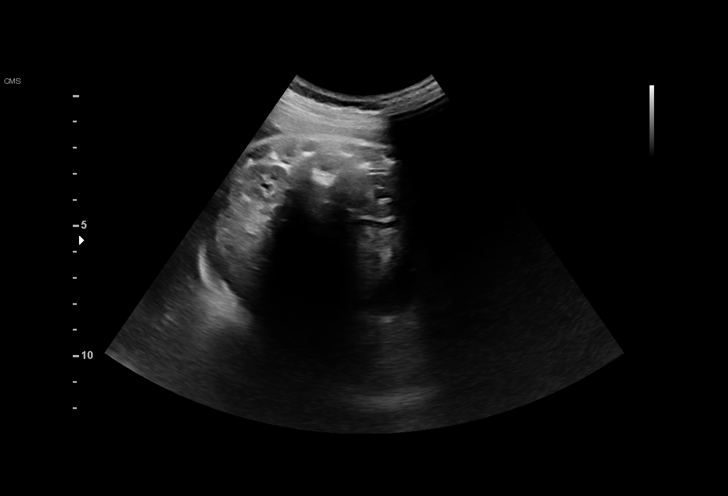
[im 10/32]
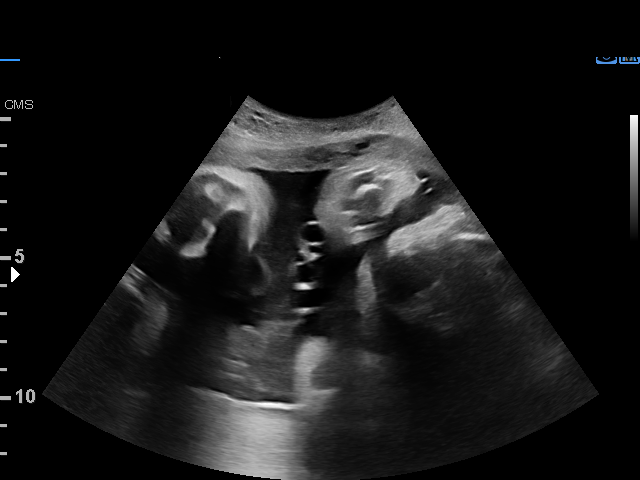
[im 12/32]
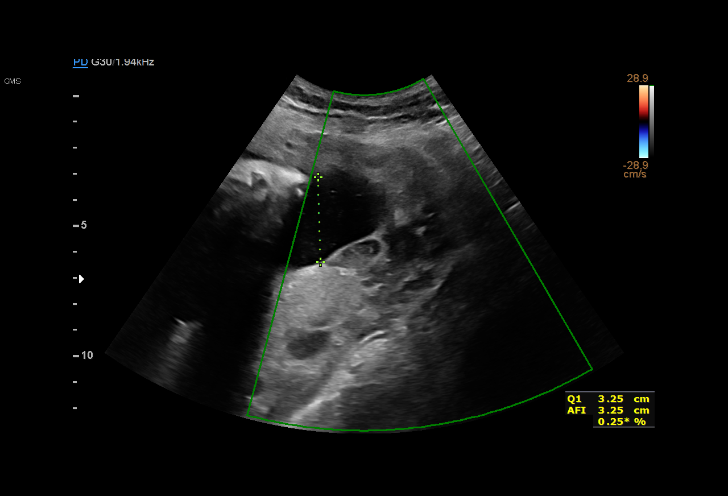
[im 14/32]
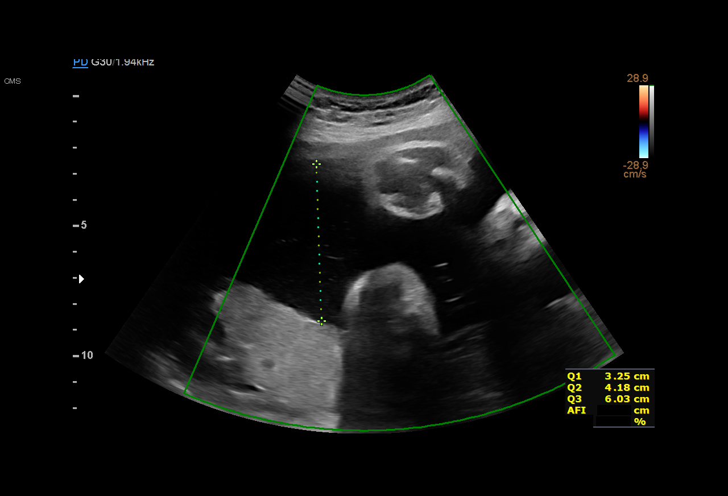
[im 17/32]
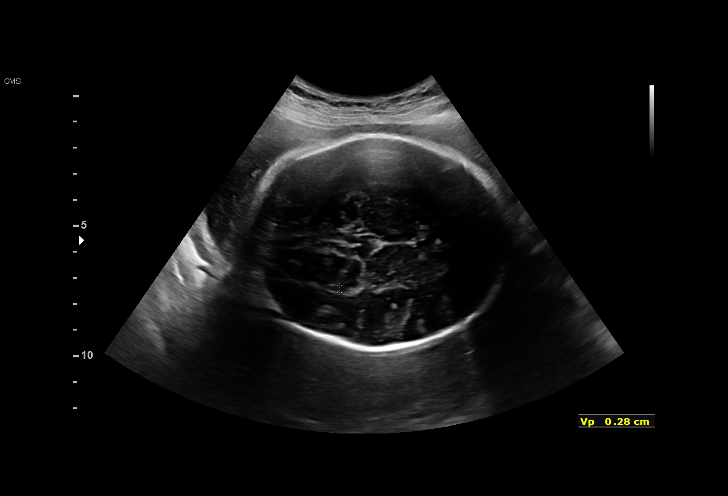
[im 18/32]
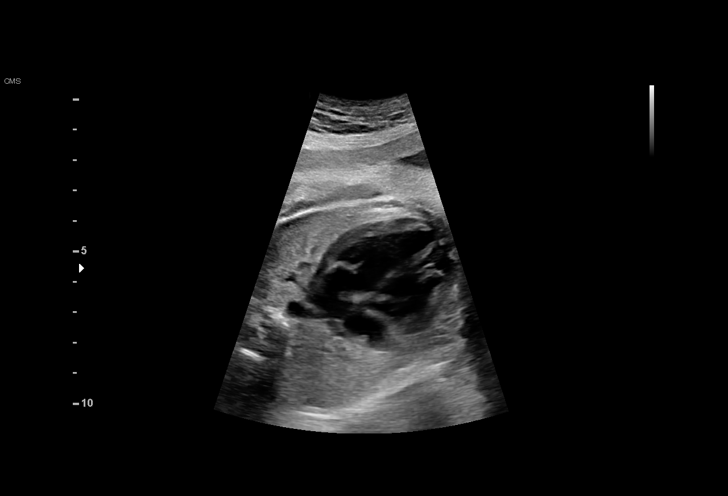
[im 20/32]
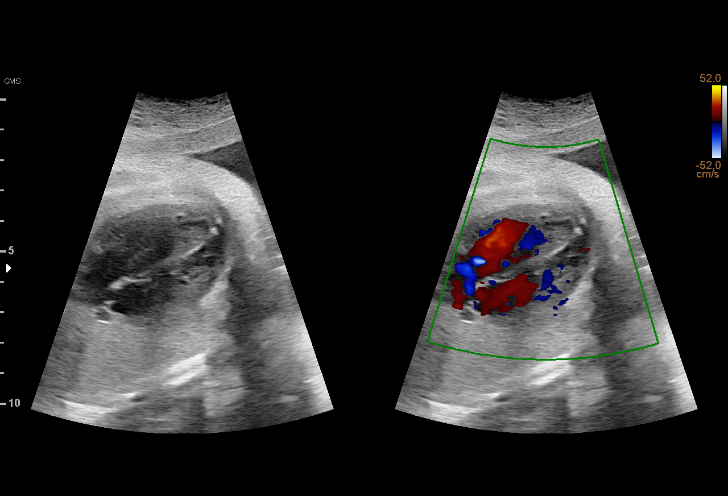
[im 22/32]
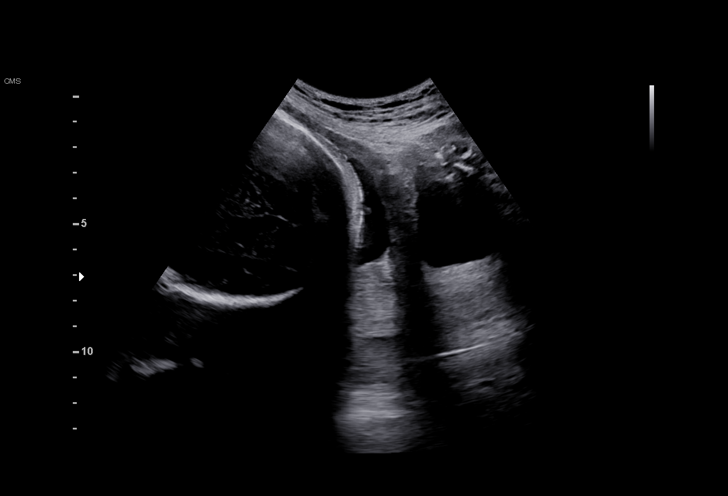
[im 25/32]
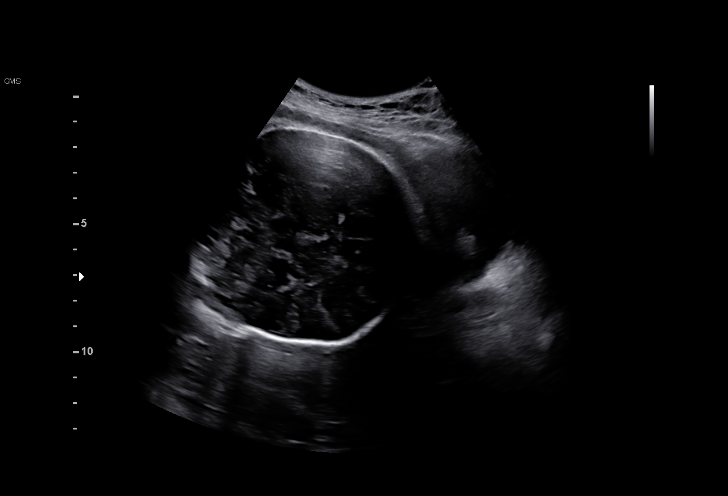
[im 27/32]
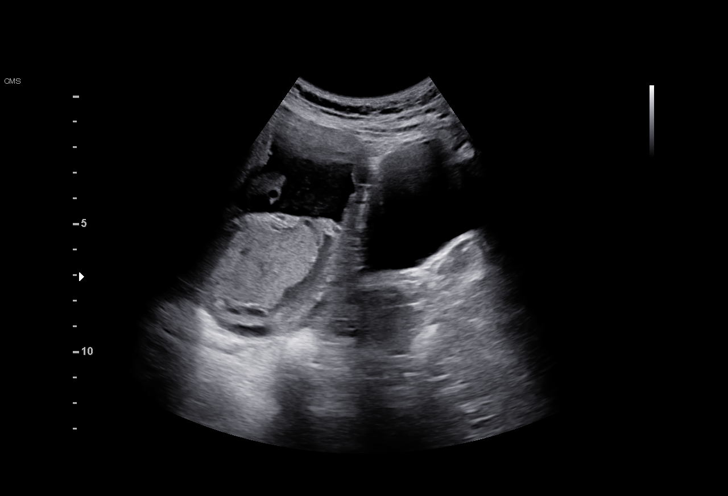
[im 29/32]
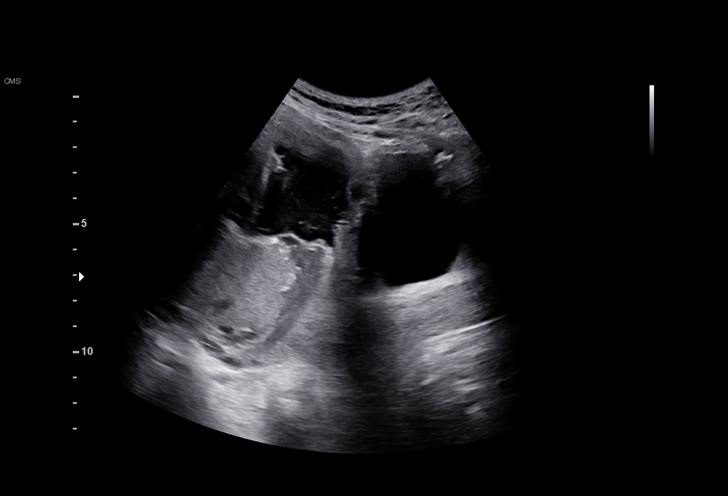
[im 32/32]
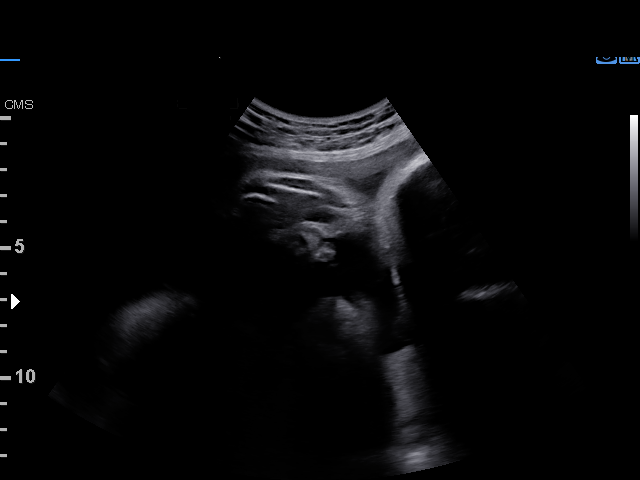

[15 of 28 positions shown; findings below may reference images not displayed]

[REDACTED]
                   [id number]

Indications

 Placenta previa specified as without
 hemorrhage, third trimester
 Advanced maternal age primigravida 35+,
 third trimester (40 y.o)
 36 weeks gestation of pregnancy
 LR NIPS/Negative AFP/Negative Horizon
Fetal Evaluation

 Num Of Fetuses:         1
 Fetal Heart Rate(bpm):  166
 Cardiac Activity:       Observed
 Presentation:           Cephalic
 Placenta:               Posterior Previa
 P. Cord Insertion:      Previously Visualized

 Amniotic Fluid
 AFI FV:      Within normal limits

 AFI Sum(cm)     %Tile       Largest Pocket(cm)
 18.2            69          6

 RUQ(cm)       RLQ(cm)       LUQ(cm)        LLQ(cm)
 3.3           4.7           4.2            6
Biophysical Evaluation

 Amniotic F.V:   Within normal limits       F. Tone:        Observed
 F. Movement:    Observed                   Score:          [DATE]
 F. Breathing:   Observed
Biometry

 LV:        2.8  mm
OB History

 Blood Type:   O+
 Gravidity:    4         Term:   2         SAB:   1
 Living:       2
Gestational Age

 LMP:           35w 2d        Date:  11/01/20                 EDD:   08/08/21
 Best:          36w 6d     Det. By:  U/S C R L  (01/05/21)    EDD:   07/28/21
Anatomy

 Ventricles:            Appears normal         Kidneys:                Appear normal
 Diaphragm:             Appears normal         Bladder:                Appears normal
 Stomach:               Appears normal, left
                        sided
Cervix Uterus Adnexa

 Cervix
 Normal appearance by transabdominal scan.
Comments

 This patient was seen for a BPP due to advanced maternal
 age (40 years old) and placenta previa.  She denies any
 problems since her last exam.
 A biophysical profile performed today was [DATE].
 There was normal amniotic fluid noted on today's ultrasound
 exam.
 A posterior placenta previa continues to be noted on today's
 exam.
 As the placenta still appears to be covering the cervix based
 on her abdominal scan, the patient declined to have a
 transvaginal ultrasound performed today.
 She is already scheduled for a cesarean delivery in 2 days.

## 2022-10-09 DIAGNOSIS — Z419 Encounter for procedure for purposes other than remedying health state, unspecified: Secondary | ICD-10-CM | POA: Diagnosis not present

## 2022-11-09 DIAGNOSIS — Z419 Encounter for procedure for purposes other than remedying health state, unspecified: Secondary | ICD-10-CM | POA: Diagnosis not present

## 2022-12-10 DIAGNOSIS — Z419 Encounter for procedure for purposes other than remedying health state, unspecified: Secondary | ICD-10-CM | POA: Diagnosis not present

## 2023-01-09 DIAGNOSIS — Z419 Encounter for procedure for purposes other than remedying health state, unspecified: Secondary | ICD-10-CM | POA: Diagnosis not present

## 2023-02-09 DIAGNOSIS — Z419 Encounter for procedure for purposes other than remedying health state, unspecified: Secondary | ICD-10-CM | POA: Diagnosis not present

## 2023-03-11 DIAGNOSIS — Z419 Encounter for procedure for purposes other than remedying health state, unspecified: Secondary | ICD-10-CM | POA: Diagnosis not present

## 2023-04-11 DIAGNOSIS — Z419 Encounter for procedure for purposes other than remedying health state, unspecified: Secondary | ICD-10-CM | POA: Diagnosis not present

## 2023-05-12 DIAGNOSIS — Z419 Encounter for procedure for purposes other than remedying health state, unspecified: Secondary | ICD-10-CM | POA: Diagnosis not present

## 2023-06-09 DIAGNOSIS — Z419 Encounter for procedure for purposes other than remedying health state, unspecified: Secondary | ICD-10-CM | POA: Diagnosis not present

## 2023-07-21 DIAGNOSIS — Z419 Encounter for procedure for purposes other than remedying health state, unspecified: Secondary | ICD-10-CM | POA: Diagnosis not present

## 2023-08-20 DIAGNOSIS — Z419 Encounter for procedure for purposes other than remedying health state, unspecified: Secondary | ICD-10-CM | POA: Diagnosis not present

## 2023-09-20 DIAGNOSIS — Z419 Encounter for procedure for purposes other than remedying health state, unspecified: Secondary | ICD-10-CM | POA: Diagnosis not present

## 2023-10-20 DIAGNOSIS — Z419 Encounter for procedure for purposes other than remedying health state, unspecified: Secondary | ICD-10-CM | POA: Diagnosis not present

## 2023-11-20 DIAGNOSIS — Z419 Encounter for procedure for purposes other than remedying health state, unspecified: Secondary | ICD-10-CM | POA: Diagnosis not present

## 2023-12-03 ENCOUNTER — Ambulatory Visit: Admitting: Obstetrics and Gynecology

## 2023-12-19 ENCOUNTER — Ambulatory Visit (INDEPENDENT_AMBULATORY_CARE_PROVIDER_SITE_OTHER): Admitting: Obstetrics and Gynecology

## 2023-12-19 ENCOUNTER — Encounter: Payer: Self-pay | Admitting: Obstetrics and Gynecology

## 2023-12-19 ENCOUNTER — Other Ambulatory Visit (HOSPITAL_COMMUNITY)
Admission: RE | Admit: 2023-12-19 | Discharge: 2023-12-19 | Disposition: A | Discharge status: Home / Self Care | Source: Ambulatory Visit | Attending: Obstetrics and Gynecology | Admitting: Obstetrics and Gynecology

## 2023-12-19 VITALS — BP 160/85 | HR 86 | Ht 63.0 in | Wt 119.0 lb

## 2023-12-19 DIAGNOSIS — Z113 Encounter for screening for infections with a predominantly sexual mode of transmission: Secondary | ICD-10-CM | POA: Diagnosis present

## 2023-12-19 DIAGNOSIS — Z124 Encounter for screening for malignant neoplasm of cervix: Secondary | ICD-10-CM | POA: Insufficient documentation

## 2023-12-19 DIAGNOSIS — N898 Other specified noninflammatory disorders of vagina: Secondary | ICD-10-CM

## 2023-12-19 DIAGNOSIS — Z30431 Encounter for routine checking of intrauterine contraceptive device: Secondary | ICD-10-CM

## 2023-12-19 DIAGNOSIS — Z975 Presence of (intrauterine) contraceptive device: Secondary | ICD-10-CM

## 2023-12-19 NOTE — Patient Instructions (Signed)

## 2023-12-19 NOTE — Progress Notes (Unsigned)
 Pt presents for IUD check. IUD placed 02-07-22 Pt reports intermittent abd pain and swelling. Pt reports spotting after intercourse   Last PAP 06/2019. Requesting PAP. Declines STD testing

## 2023-12-21 ENCOUNTER — Emergency Department (HOSPITAL_COMMUNITY)

## 2023-12-21 ENCOUNTER — Emergency Department (HOSPITAL_COMMUNITY)
Admission: EM | Admit: 2023-12-21 | Discharge: 2023-12-21 | Disposition: A | Discharge status: Home / Self Care | Attending: Emergency Medicine | Admitting: Emergency Medicine

## 2023-12-21 ENCOUNTER — Encounter (HOSPITAL_COMMUNITY): Payer: Self-pay | Admitting: *Deleted

## 2023-12-21 ENCOUNTER — Other Ambulatory Visit: Payer: Self-pay

## 2023-12-21 DIAGNOSIS — R5383 Other fatigue: Secondary | ICD-10-CM | POA: Diagnosis not present

## 2023-12-21 DIAGNOSIS — R519 Headache, unspecified: Secondary | ICD-10-CM | POA: Diagnosis not present

## 2023-12-21 DIAGNOSIS — Z975 Presence of (intrauterine) contraceptive device: Secondary | ICD-10-CM | POA: Insufficient documentation

## 2023-12-21 DIAGNOSIS — R42 Dizziness and giddiness: Secondary | ICD-10-CM | POA: Diagnosis not present

## 2023-12-21 DIAGNOSIS — Z419 Encounter for procedure for purposes other than remedying health state, unspecified: Secondary | ICD-10-CM | POA: Diagnosis not present

## 2023-12-21 LAB — RESP PANEL BY RT-PCR (RSV, FLU A&B, COVID)  RVPGX2
Influenza A by PCR: NEGATIVE
Influenza B by PCR: NEGATIVE
Resp Syncytial Virus by PCR: NEGATIVE
SARS Coronavirus 2 by RT PCR: NEGATIVE

## 2023-12-21 LAB — URINALYSIS, ROUTINE W REFLEX MICROSCOPIC
Bacteria, UA: NONE SEEN
Bilirubin Urine: NEGATIVE
Glucose, UA: NEGATIVE mg/dL
Ketones, ur: NEGATIVE mg/dL
Nitrite: NEGATIVE
Protein, ur: NEGATIVE mg/dL
Specific Gravity, Urine: 1.005 (ref 1.005–1.030)
pH: 6 (ref 5.0–8.0)

## 2023-12-21 LAB — CBC
HCT: 40.3 % (ref 36.0–46.0)
Hemoglobin: 13.3 g/dL (ref 12.0–15.0)
MCH: 27.8 pg (ref 26.0–34.0)
MCHC: 33 g/dL (ref 30.0–36.0)
MCV: 84.3 fL (ref 80.0–100.0)
Platelets: 326 K/uL (ref 150–400)
RBC: 4.78 MIL/uL (ref 3.87–5.11)
RDW: 14.1 % (ref 11.5–15.5)
WBC: 12.9 K/uL — ABNORMAL HIGH (ref 4.0–10.5)
nRBC: 0 % (ref 0.0–0.2)

## 2023-12-21 LAB — COMPREHENSIVE METABOLIC PANEL WITH GFR
ALT: 10 U/L (ref 0–44)
AST: 14 U/L — ABNORMAL LOW (ref 15–41)
Albumin: 4.1 g/dL (ref 3.5–5.0)
Alkaline Phosphatase: 41 U/L (ref 38–126)
Anion gap: 12 (ref 5–15)
BUN: 7 mg/dL (ref 6–20)
CO2: 21 mmol/L — ABNORMAL LOW (ref 22–32)
Calcium: 9.4 mg/dL (ref 8.9–10.3)
Chloride: 103 mmol/L (ref 98–111)
Creatinine, Ser: 0.76 mg/dL (ref 0.44–1.00)
GFR, Estimated: >60 mL/min (ref >60)
Glucose, Bld: 113 mg/dL — ABNORMAL HIGH (ref 70–99)
Potassium: 3.6 mmol/L (ref 3.5–5.1)
Sodium: 136 mmol/L (ref 135–145)
Total Bilirubin: 0.7 mg/dL (ref 0.0–1.2)
Total Protein: 7.5 g/dL (ref 6.5–8.1)

## 2023-12-21 LAB — HCG, SERUM, QUALITATIVE: Preg, Serum: NEGATIVE

## 2023-12-21 LAB — TROPONIN I (HIGH SENSITIVITY)
Troponin I (High Sensitivity): <2 ng/L (ref <18)
Troponin I (High Sensitivity): 3 ng/L (ref <18)

## 2023-12-21 MED ORDER — ACETAMINOPHEN 500 MG PO TABS
1000.0000 mg | ORAL_TABLET | Freq: Once | ORAL | Status: AC
  Administered 2023-12-21: 1000 mg via ORAL
  Filled 2023-12-21: qty 2

## 2023-12-21 MED ORDER — KETOROLAC TROMETHAMINE 15 MG/ML IJ SOLN
15.0000 mg | Freq: Once | INTRAMUSCULAR | Status: AC
Start: 2023-12-21 — End: 2023-12-21
  Administered 2023-12-21: 15 mg via INTRAVENOUS
  Filled 2023-12-21: qty 1

## 2023-12-21 MED ORDER — METOCLOPRAMIDE HCL 5 MG/ML IJ SOLN
10.0000 mg | Freq: Once | INTRAMUSCULAR | Status: AC
  Administered 2023-12-21: 10 mg via INTRAVENOUS
  Filled 2023-12-21: qty 2

## 2023-12-21 MED ORDER — SODIUM CHLORIDE 0.9 % IV BOLUS
1000.0000 mL | Freq: Once | INTRAVENOUS | Status: AC
  Administered 2023-12-21: 1000 mL via INTRAVENOUS

## 2023-12-21 MED ORDER — AMLODIPINE BESYLATE 5 MG PO TABS: 5.0000 mg | ORAL_TABLET | Freq: Every day | ORAL | 0 refills | Status: AC

## 2023-12-21 NOTE — Progress Notes (Signed)
   GYNECOLOGY PROGRESS NOTE  History:  43 y.o. [id number] presents to [facility] for IUD check. Reports intermittent abdominal pain, she also reports vaginal discharge   The following portions of the patient's history were reviewed and updated as appropriate: allergies, current medications, past family history, past medical history, past social history, past surgical history and problem list. Last pap smear on 2021 that was normal  Health Maintenance Due  Topic Date Due   Hepatitis B Vaccines 19-59 Average Risk (1 of 3 - 19+ 3-dose series) Never done   HPV VACCINES (1 - 3-dose SCDM series) Never done   Mammogram  Never done   Influenza Vaccine  11/09/2023   COVID-19 Vaccine (1 - 2024-25 season) Never done     Review of Systems:  Pertinent items are noted in HPI.   Objective:  Physical Exam Blood pressure (!) 160/85, pulse 86, height 5' 3 (1.6 m), weight 119 lb (54 kg). VS reviewed, nursing note reviewed,  Constitutional: well developed, well nourished, no distress HEENT: normocephalic CV: normal rate Pulm/chest wall: normal effort Breast Exam: deferred Abdomen: soft Neuro: alert and oriented x 3 Skin: warm, dry Psych: affect normal Pelvic exam: Cervix pink, visually closed, without lesion, scant white creamy discharge, vaginal walls and external genitalia normal, IUD strings visualized and appropriate length   Assessment & Plan:   1. IUD check up (Primary) In place, discussed abdominal cramping, reports, but tolerable, will obtain swab, reports notify if continues will do ultrasound  2. Cervical cancer screening  - Cytology - PAP( [place])  3. Vaginal discharge  - Cervicovaginal ancillary only( [place])  Return in about 1 year (around 12/18/2024) for [place] Nidia Delores, FNP 2:56 PM

## 2023-12-21 NOTE — Discharge Instructions (Signed)
 While you were in the emergency room, you have a CT scan done of your head that was negative.  He also had blood work done that was normal.  Your blood pressure was a little bit elevated.  If that your prescription for your blood pressure medication called amlodipine .  You may take 5 mg daily.  Please follow-up with your primary care doctor for further management of your blood pressure.  Drink plenty of water  at home.  You may take Motrin  and Tylenol  for headache.  Return to the emergency room if you develop sudden worsening of your headache, trouble with your vision, speech, or difficulty walking.

## 2023-12-21 NOTE — ED Provider Triage Note (Signed)
 Emergency Medicine Provider Triage Evaluation Note  Vermell Madrid , a 43 y.o. female  was evaluated in triage.  Pt complains of headache x2 days, abdominal pain, shortness of breath. Denies fevers, chest pain, nausea, vomiting. Denies dysuria, flank pain, abdominal pain. No known sick contacts.  Review of Systems  Positive:  Negative:   Physical Exam  BP (!) 160/85 (BP Location: Right Arm)   Pulse 98   Temp 98.2 F (36.8 C)   Resp 18   SpO2 100%  Gen:   Awake, no distress   Resp:  Normal effort  MSK:   Moves extremities without difficulty  Other:    Medical Decision Making  Medically screening exam initiated at 12:43 AM.  Appropriate orders placed.  Tannia Contino was informed that the remainder of the evaluation will be completed by another provider, this initial triage assessment does not replace that evaluation, and the importance of remaining in the ED until their evaluation is complete.     Ruthell Lonni FALCON, PA-C 12/21/23 [id number]

## 2023-12-21 NOTE — ED Triage Notes (Signed)
 Pt having throbbing headache with headache, dizziness, and fatigue. Also c/o lower back and right shoulder pain. Took tylenol  with improvement of pain in back but c/o to have headache

## 2023-12-21 NOTE — ED Provider Notes (Signed)
 [place] [facility]ERGENCY DEPARTMENT AT [facility] Provider Note  CSN: [id number] Arrival date & time: 12/21/23 0001  Chief Complaint(s) Headache  HPI Jessica Ewing is a 43 y.o. female who is here today for headache.  Patient reports that she sometimes gets headaches when she is not sleeping well.  She started to have a headache 2 to 3 days ago that she described as being at the back of her head.  She denies any fever, trouble with her vision.   Past Medical History Past Medical History:  Diagnosis Date   Low-lying placenta    Medical history non-contributory    Patient Active Problem List   Diagnosis Date Noted   Spotting between menses 03/10/2022   Home Medication(s) Prior to Admission medications   Medication Sig Start Date End Date Taking? Authorizing Provider  acetaminophen  (TYLENOL ) 500 MG tablet Take 2 tablets (1,000 mg total) by mouth every 8 (eight) hours as needed (pain). Patient not taking: Reported on 12/19/2023 07/10/21   Clem Tawni HERO, MD  ibuprofen  (ADVIL ) 600 MG tablet Take 1 tablet (600 mg total) by mouth every 6 (six) hours as needed (pain). Patient not taking: Reported on 12/19/2023 07/10/21   Clem Tawni HERO, MD  medroxyPROGESTERone  (DEPO-PROVERA ) 150 MG/ML injection Inject 1 mL (150 mg total) into the muscle every 3 (three) months. Patient not taking: Reported on 12/19/2023 08/23/21   Eveline Lynwood MATSU, MD  Prenatal Vit-Fe Phos-FA-Omega (VITAFOL  GUMMIES) 3.33-0.333-34.8 MG CHEW CHEW 3 DOSES BY MOUTH DAILY. Patient not taking: Reported on 12/19/2023 11/23/21   Zina Jerilynn LABOR, MD                                                                                                                                    Past Surgical History Past Surgical History:  Procedure Laterality Date   CESAREAN SECTION N/A 07/08/2021   Procedure: CESAREAN SECTION;  Surgeon: Ozan, Jennifer, DO;  Location: [facility] LD ORS;  Service: Obstetrics;  Laterality: N/A;   NO PAST SURGERIES      Family History Family History  Problem Relation Age of Onset   Hypertension Mother     Social History Social History   Tobacco Use   Smoking status: Never    Passive exposure: Never   Smokeless tobacco: Never  Vaping Use   Vaping status: Never Used  Substance Use Topics   Alcohol use: Never   Drug use: Never   Allergies Patient has no known allergies.  Review of Systems Review of Systems  Physical Exam Vital Signs  I have reviewed the triage vital signs BP (!) 146/76 (BP Location: Left Arm)   Pulse 94   Temp 98.2 F (36.8 C)   Resp 17   SpO2 100%   Physical Exam Vitals and nursing note reviewed.  Eyes:     Extraocular Movements: Extraocular movements intact.     Pupils: Pupils are equal, round, and reactive to light.  Cardiovascular:     Rate and Rhythm: Normal rate.  Pulmonary:     Effort: Pulmonary effort is normal.  Skin:    General: Skin is warm.  Neurological:     Mental Status: She is alert and oriented to person, place, and time.     Cranial Nerves: No cranial nerve deficit, dysarthria or facial asymmetry.     Sensory: No sensory deficit.     Coordination: Romberg sign negative. Coordination normal.     Gait: Gait normal.     ED Results and Treatments Labs (all labs ordered are listed, but only abnormal results are displayed) Labs Reviewed  COMPREHENSIVE METABOLIC PANEL WITH GFR - Abnormal; Notable for the following components:      Result Value   CO2 21 (*)    Glucose, Bld 113 (*)    AST 14 (*)    All other components within normal limits  CBC - Abnormal; Notable for the following components:   WBC 12.9 (*)    All other components within normal limits  URINALYSIS, ROUTINE W REFLEX MICROSCOPIC - Abnormal; Notable for the following components:   Color, Urine STRAW (*)    APPearance HAZY (*)    Hgb urine dipstick MODERATE (*)    Leukocytes,Ua MODERATE (*)    All other components within normal limits  RESP PANEL BY RT-PCR (RSV, FLU  A&B, COVID)  RVPGX2  HCG, SERUM, QUALITATIVE  TROPONIN I (HIGH SENSITIVITY)  TROPONIN I (HIGH SENSITIVITY)                                                                                                                          Radiology CT Head Wo Contrast Result Date: 12/21/2023 CLINICAL DATA:  Neuro deficit, acute, stroke suspected. Headache, nausea EXAM: CT HEAD WITHOUT CONTRAST TECHNIQUE: Contiguous axial images were obtained from the base of the skull through the vertex without intravenous contrast. RADIATION DOSE REDUCTION: This exam was performed according to the departmental dose-optimization program which includes automated exposure control, adjustment of the mA and/or kV according to patient size and/or use of iterative reconstruction technique. COMPARISON:  None Available. FINDINGS: Brain: No acute intracranial abnormality. Specifically, no hemorrhage, hydrocephalus, mass lesion, acute infarction, or significant intracranial injury. Vascular: No hyperdense vessel or unexpected calcification. Skull: No acute calvarial abnormality. Sinuses/Orbits: No acute findings Other: None IMPRESSION: Normal study. Electronically Signed   By: Franky Crease M.D.   On: 12/21/2023 01:07    Pertinent labs & imaging results that were available during my care of the patient were reviewed by me and considered in my medical decision making (see MDM for details).  Medications Ordered in ED Medications  metoCLOPramide  (REGLAN ) injection 10 mg (10 mg Intravenous Given 12/21/23 0731)  ketorolac  (TORADOL ) 15 MG/ML injection 15 mg (15 mg Intravenous Given 12/21/23 0731)  acetaminophen  (TYLENOL ) tablet 1,000 mg (1,000 mg Oral Given 12/21/23 0732)  Procedures Procedures  (including critical care time)  Medical Decision Making / ED Course   This patient presents to the ED for concern  of headache, this involves an extensive number of treatment options, and is a complaint that carries with it a high risk of complications and morbidity.  The differential diagnosis includes tension headache, less likely CVA, less likely vertebral dissection, slight carotid dissection, less likely meningitis.  MDM: On exam, patient overall well-appearing.  She has normal vital signs.  No cranial nerve deficits, no other neurological deficits.  Low suspicion for ICH.  Low suspicion for dissection.  Patient had CT imaging done in triage which was negative.  Provided the patient with some Reglan , Toradol  and Tylenol .  Headache resolved.  Patient did express some concern about her blood pressure being a little bit elevated.  Will start her on low-dose amlodipine .  Will provide her with some fluids, discharge.   Additional history obtained:  -External records from outside source obtained and reviewed including: Chart review including previous notes, labs, imaging, consultation notes   Lab Tests: -I ordered, reviewed, and interpreted labs.   The pertinent results include:   Labs Reviewed  COMPREHENSIVE METABOLIC PANEL WITH GFR - Abnormal; Notable for the following components:      Result Value   CO2 21 (*)    Glucose, Bld 113 (*)    AST 14 (*)    All other components within normal limits  CBC - Abnormal; Notable for the following components:   WBC 12.9 (*)    All other components within normal limits  URINALYSIS, ROUTINE W REFLEX MICROSCOPIC - Abnormal; Notable for the following components:   Color, Urine STRAW (*)    APPearance HAZY (*)    Hgb urine dipstick MODERATE (*)    Leukocytes,Ua MODERATE (*)    All other components within normal limits  RESP PANEL BY RT-PCR (RSV, FLU A&B, COVID)  RVPGX2  HCG, SERUM, QUALITATIVE  TROPONIN I (HIGH SENSITIVITY)  TROPONIN I (HIGH SENSITIVITY)      Imaging Studies ordered: I ordered imaging studies including head CT I independently visualized and  interpreted imaging. I agree with the radiologist interpretation   Medicines ordered and prescription drug management: Meds ordered this encounter  Medications   metoCLOPramide  (REGLAN ) injection 10 mg   ketorolac  (TORADOL ) 15 MG/ML injection 15 mg   acetaminophen  (TYLENOL ) tablet 1,000 mg    -I have reviewed the patients home medicines and have made adjustments as needed    Cardiac Monitoring: The patient was maintained on a cardiac monitor.  I personally viewed and interpreted the cardiac monitored which showed an underlying rhythm of: Normal sinus rhythm  Social Determinants of Health:  Factors impacting patients care include: Lack of access to primary care   Reevaluation: After the interventions noted above, I reevaluated the patient and found that they have :improved  Co morbidities that complicate the patient evaluation  Past Medical History:  Diagnosis Date   Low-lying placenta    Medical history non-contributory       Dispostion: I considered admission for this patient, however she is appropriate for discharge.     Final Clinical Impression(s) / ED Diagnoses Final diagnoses:  None     @PCDICTATION @    Mannie Pac T, DO 12/21/23 [phone]

## 2023-12-21 NOTE — ED Triage Notes (Signed)
 Pt c/o headache, back pain (that resolves with ibuprofen ) & dizziness x 3 days. States the dizziness increases with change of position.

## 2023-12-24 DIAGNOSIS — Z0000 Encounter for general adult medical examination without abnormal findings: Secondary | ICD-10-CM | POA: Diagnosis not present

## 2023-12-24 DIAGNOSIS — R5383 Other fatigue: Secondary | ICD-10-CM | POA: Diagnosis not present

## 2023-12-24 DIAGNOSIS — Z1159 Encounter for screening for other viral diseases: Secondary | ICD-10-CM | POA: Diagnosis not present

## 2023-12-24 DIAGNOSIS — I10 Essential (primary) hypertension: Secondary | ICD-10-CM | POA: Diagnosis not present

## 2023-12-24 DIAGNOSIS — Z3200 Encounter for pregnancy test, result unknown: Secondary | ICD-10-CM | POA: Diagnosis not present

## 2023-12-24 DIAGNOSIS — E559 Vitamin D deficiency, unspecified: Secondary | ICD-10-CM | POA: Diagnosis not present

## 2023-12-24 LAB — CERVICOVAGINAL ANCILLARY ONLY
Bacterial Vaginitis (gardnerella): POSITIVE — AB
Candida Glabrata: NEGATIVE
Candida Vaginitis: POSITIVE — AB
Chlamydia: NEGATIVE
Comment: NEGATIVE
Comment: NEGATIVE
Comment: NEGATIVE
Comment: NEGATIVE
Comment: NEGATIVE
Comment: NORMAL
Neisseria Gonorrhea: NEGATIVE
Trichomonas: NEGATIVE

## 2023-12-25 ENCOUNTER — Ambulatory Visit: Payer: Self-pay | Admitting: Obstetrics and Gynecology

## 2023-12-25 MED ORDER — METRONIDAZOLE 500 MG PO TABS: 500.0000 mg | ORAL_TABLET | Freq: Two times a day (BID) | ORAL | 0 refills | Status: AC

## 2023-12-25 MED ORDER — FLUCONAZOLE 150 MG PO TABS
150.0000 mg | ORAL_TABLET | Freq: Once | ORAL | 1 refills | Status: AC
Start: 2023-12-25 — End: 2023-12-25

## 2023-12-26 ENCOUNTER — Other Ambulatory Visit: Payer: Self-pay | Admitting: Nurse Practitioner

## 2023-12-26 DIAGNOSIS — Z1231 Encounter for screening mammogram for malignant neoplasm of breast: Secondary | ICD-10-CM

## 2023-12-26 LAB — CYTOLOGY - PAP
Comment: NEGATIVE
Diagnosis: NEGATIVE
High risk HPV: NEGATIVE

## 2024-01-07 DIAGNOSIS — D721 Eosinophilia, unspecified: Secondary | ICD-10-CM | POA: Diagnosis not present

## 2024-01-07 DIAGNOSIS — R0602 Shortness of breath: Secondary | ICD-10-CM | POA: Diagnosis not present

## 2024-01-07 DIAGNOSIS — E559 Vitamin D deficiency, unspecified: Secondary | ICD-10-CM | POA: Diagnosis not present

## 2024-01-07 DIAGNOSIS — I10 Essential (primary) hypertension: Secondary | ICD-10-CM | POA: Diagnosis not present

## 2024-02-26 DIAGNOSIS — J4520 Mild intermittent asthma, uncomplicated: Secondary | ICD-10-CM | POA: Diagnosis not present

## 2024-02-26 DIAGNOSIS — R0602 Shortness of breath: Secondary | ICD-10-CM | POA: Diagnosis not present

## 2024-03-31 DIAGNOSIS — I10 Essential (primary) hypertension: Secondary | ICD-10-CM | POA: Diagnosis not present

## 2024-03-31 DIAGNOSIS — J4520 Mild intermittent asthma, uncomplicated: Secondary | ICD-10-CM | POA: Diagnosis not present

## 2024-03-31 DIAGNOSIS — Z3200 Encounter for pregnancy test, result unknown: Secondary | ICD-10-CM | POA: Diagnosis not present

## 2024-03-31 DIAGNOSIS — E559 Vitamin D deficiency, unspecified: Secondary | ICD-10-CM | POA: Diagnosis not present

## 2024-04-08 ENCOUNTER — Other Ambulatory Visit: Payer: Self-pay | Admitting: Nurse Practitioner

## 2024-04-08 DIAGNOSIS — Z1231 Encounter for screening mammogram for malignant neoplasm of breast: Secondary | ICD-10-CM
